# Patient Record
Sex: Female | Born: 1995 | Race: White | Hispanic: No | Marital: Single | Smoking: Never smoker
Health system: Southern US, Community
[De-identification: ages and names within clinical notes are randomized; demographics above are authoritative.]

## PROBLEM LIST (undated history)

## (undated) DIAGNOSIS — Z818 Family history of other mental and behavioral disorders: Secondary | ICD-10-CM

## (undated) DIAGNOSIS — I498 Other specified cardiac arrhythmias: Secondary | ICD-10-CM

## (undated) DIAGNOSIS — G90A Postural orthostatic tachycardia syndrome (POTS): Secondary | ICD-10-CM

## (undated) DIAGNOSIS — E559 Vitamin D deficiency, unspecified: Secondary | ICD-10-CM

## (undated) DIAGNOSIS — I10 Essential (primary) hypertension: Secondary | ICD-10-CM

## (undated) DIAGNOSIS — R000 Tachycardia, unspecified: Secondary | ICD-10-CM

## (undated) DIAGNOSIS — R0789 Other chest pain: Secondary | ICD-10-CM

## (undated) DIAGNOSIS — R5383 Other fatigue: Secondary | ICD-10-CM

## (undated) DIAGNOSIS — J309 Allergic rhinitis, unspecified: Secondary | ICD-10-CM

## (undated) DIAGNOSIS — Z8349 Family history of other endocrine, nutritional and metabolic diseases: Secondary | ICD-10-CM

## (undated) DIAGNOSIS — R42 Dizziness and giddiness: Secondary | ICD-10-CM

## (undated) DIAGNOSIS — D649 Anemia, unspecified: Secondary | ICD-10-CM

## (undated) DIAGNOSIS — R253 Fasciculation: Secondary | ICD-10-CM

## (undated) DIAGNOSIS — F4321 Adjustment disorder with depressed mood: Secondary | ICD-10-CM

## (undated) DIAGNOSIS — F429 Obsessive-compulsive disorder, unspecified: Secondary | ICD-10-CM

## (undated) DIAGNOSIS — I951 Orthostatic hypotension: Secondary | ICD-10-CM

## (undated) DIAGNOSIS — F4323 Adjustment disorder with mixed anxiety and depressed mood: Secondary | ICD-10-CM

## (undated) DIAGNOSIS — R002 Palpitations: Secondary | ICD-10-CM

## (undated) DIAGNOSIS — R0602 Shortness of breath: Secondary | ICD-10-CM

## (undated) DIAGNOSIS — J0100 Acute maxillary sinusitis, unspecified: Secondary | ICD-10-CM

## (undated) DIAGNOSIS — R4589 Other symptoms and signs involving emotional state: Secondary | ICD-10-CM

## (undated) HISTORY — DX: Palpitations: R00.2

## (undated) HISTORY — DX: Anemia, unspecified: D64.9

## (undated) HISTORY — PX: NO PAST SURGERIES: SHX2092

## (undated) HISTORY — DX: Obsessive-compulsive disorder, unspecified: F42.9

## (undated) HISTORY — DX: Other fatigue: R53.83

## (undated) HISTORY — DX: Family history of other endocrine, nutritional and metabolic diseases: Z83.49

## (undated) HISTORY — DX: Shortness of breath: R06.02

## (undated) HISTORY — DX: Fasciculation: R25.3

## (undated) HISTORY — DX: Allergic rhinitis, unspecified: J30.9

## (undated) HISTORY — DX: Essential (primary) hypertension: I10

## (undated) HISTORY — DX: Family history of other mental and behavioral disorders: Z81.8

## (undated) HISTORY — DX: Orthostatic hypotension: I95.1

## (undated) HISTORY — DX: Dizziness and giddiness: R42

## (undated) HISTORY — DX: Other chest pain: R07.89

## (undated) HISTORY — DX: Postural orthostatic tachycardia syndrome (POTS): G90.A

## (undated) HISTORY — DX: Tachycardia, unspecified: R00.0

## (undated) HISTORY — DX: Other specified cardiac arrhythmias: I49.8

## (undated) HISTORY — DX: Acute maxillary sinusitis, unspecified: J01.00

## (undated) HISTORY — DX: Adjustment disorder with depressed mood: F43.21

## (undated) HISTORY — DX: Vitamin D deficiency, unspecified: E55.9

## (undated) HISTORY — DX: Adjustment disorder with mixed anxiety and depressed mood: F43.23

## (undated) HISTORY — DX: Other symptoms and signs involving emotional state: R45.89

---
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
## 2014-04-27 ENCOUNTER — Encounter: Payer: Self-pay | Admitting: Internal Medicine

## 2014-07-01 ENCOUNTER — Encounter: Payer: Self-pay | Admitting: Internal Medicine

## 2014-11-03 ENCOUNTER — Encounter: Payer: Self-pay | Admitting: Internal Medicine

## 2014-11-03 ENCOUNTER — Ambulatory Visit (INDEPENDENT_AMBULATORY_CARE_PROVIDER_SITE_OTHER): Payer: BC Managed Care – PPO | Admitting: Internal Medicine

## 2014-11-03 VITALS — BP 106/50 | Temp 98.7°F | Ht 65.5 in | Wt 121.8 lb

## 2014-11-03 DIAGNOSIS — R109 Unspecified abdominal pain: Secondary | ICD-10-CM

## 2014-11-03 DIAGNOSIS — R002 Palpitations: Secondary | ICD-10-CM | POA: Diagnosis not present

## 2014-11-03 DIAGNOSIS — Z299 Encounter for prophylactic measures, unspecified: Secondary | ICD-10-CM

## 2014-11-03 DIAGNOSIS — E559 Vitamin D deficiency, unspecified: Secondary | ICD-10-CM | POA: Diagnosis not present

## 2014-11-03 DIAGNOSIS — Z418 Encounter for other procedures for purposes other than remedying health state: Secondary | ICD-10-CM

## 2014-11-03 LAB — LIPID PANEL
Cholesterol: 206 mg/dL — ABNORMAL HIGH (ref 0–200)
HDL: 50.5 mg/dL (ref >39.00)
LDL CALC: 133 mg/dL — AB (ref 0–99)
NonHDL: 155.5
Total CHOL/HDL Ratio: 4
Triglycerides: 111 mg/dL (ref 0.0–149.0)
VLDL: 22.2 mg/dL (ref 0.0–40.0)

## 2014-11-03 LAB — CBC WITH DIFFERENTIAL/PLATELET
BASOS PCT: 0.7 % (ref 0.0–3.0)
Basophils Absolute: 0 10*3/uL (ref 0.0–0.1)
EOS PCT: 4.4 % (ref 0.0–5.0)
Eosinophils Absolute: 0.3 10*3/uL (ref 0.0–0.7)
HCT: 37 % (ref 36.0–46.0)
HEMOGLOBIN: 11.8 g/dL — AB (ref 12.0–15.0)
LYMPHS PCT: 28.5 % (ref 12.0–46.0)
Lymphs Abs: 1.7 10*3/uL (ref 0.7–4.0)
MCHC: 31.8 g/dL (ref 30.0–36.0)
MCV: 77.8 fl — AB (ref 78.0–100.0)
MONO ABS: 0.5 10*3/uL (ref 0.1–1.0)
Monocytes Relative: 8.9 % (ref 3.0–12.0)
NEUTROS ABS: 3.4 10*3/uL (ref 1.4–7.7)
NEUTROS PCT: 57.5 % (ref 43.0–77.0)
Platelets: 343 10*3/uL (ref 150.0–400.0)
RBC: 4.75 Mil/uL (ref 3.87–5.11)
RDW: 20.5 % — ABNORMAL HIGH (ref 11.5–14.6)
WBC: 5.9 10*3/uL (ref 4.5–10.5)

## 2014-11-03 LAB — BASIC METABOLIC PANEL
BUN: 7 mg/dL (ref 6–23)
CO2: 26 mEq/L (ref 19–32)
Calcium: 9.5 mg/dL (ref 8.4–10.5)
Chloride: 109 mEq/L (ref 96–112)
Creatinine, Ser: 0.8 mg/dL (ref 0.4–1.2)
GFR: 104.02 mL/min (ref >60.00)
GLUCOSE: 92 mg/dL (ref 70–99)
POTASSIUM: 3.6 meq/L (ref 3.5–5.1)
Sodium: 142 mEq/L (ref 135–145)

## 2014-11-03 LAB — HEPATIC FUNCTION PANEL
ALK PHOS: 57 U/L (ref 39–117)
ALT: 10 U/L (ref 0–35)
AST: 15 U/L (ref 0–37)
Albumin: 4.4 g/dL (ref 3.5–5.2)
BILIRUBIN DIRECT: 0 mg/dL (ref 0.0–0.3)
TOTAL PROTEIN: 7.1 g/dL (ref 6.0–8.3)
Total Bilirubin: 0.2 mg/dL (ref 0.2–1.2)

## 2014-11-03 LAB — IBC PANEL
Iron: 22 ug/dL — ABNORMAL LOW (ref 42–145)
Saturation Ratios: 5.1 % — ABNORMAL LOW (ref 20.0–50.0)
Transferrin: 310.3 mg/dL (ref 212.0–360.0)

## 2014-11-03 LAB — TSH: TSH: 2.66 u[IU]/mL (ref 0.35–5.50)

## 2014-11-03 LAB — SEDIMENTATION RATE: SED RATE: 8 mm/h (ref 0–22)

## 2014-11-03 LAB — T3, FREE: T3, Free: 3.2 pg/mL (ref 2.3–4.2)

## 2014-11-03 LAB — T4, FREE: Free T4: 0.71 ng/dL (ref 0.60–1.60)

## 2014-11-03 NOTE — Progress Notes (Signed)
Pre visit review using our clinic review tool, if applicable. No additional management support is needed unless otherwise documented below in the visit note.  Chief Complaint  Patient presents with  . Establish Care    HPI: Patient  [name]  18 y.o. comes in today for reestablshing a s new patient  Last visit was over 3 years ago .  She has a hx of  Depression and cutting better  Now Vit d defic   And  Low iron    Not a recent c heck .  Has some ocd  Sx .  Doesn't go out side much and exercise . Currently more anxious than depressed . Sees Dr Toy Care.  She had been vegan  But now eats some meat  Gtcc past semester   Jan to continue still stays  room     No more cutting.  Doing better with this.  Stomach same foods all the time cause stomach an issue .   Period  Monthly .   Last   5 - 6 day s. No sa  Attending school CC on line and  inperson doing well   Health Maintenance  Topic Date Due  . TETANUS/TDAP  03/06/2013  . INFLUENZA VACCINE  02/11/2015 (Originally 06/13/2014)  . PAP SMEAR  10/14/2015 (Originally 03/06/2012)   Health Maintenance Review LIFESTYLE:  Exercise:  no Tobacco/ETS: no Alcohol: no Sugar beverages: 2 cokes per day  Large  Water  Sleep: about 8 hours  Drug use: no   ROS:  GEN/ HEENT: No fever, significant weight changes sweats headaches vision problems hearing changes, CV/ PULM; No chest pain shortness of breath cough, syncope,edema  change in exercise tolerance. Has ocass palpitiations     Worries about this has beend checked in past for this  ? Hx of pots and told to eat salt in the past  GI /GU: No adominal pain, vomiting, change in bowel habits. No blood in the stool. No significant GU symptoms. SKIN/HEME: ,no acute skin rashes suspicious lesions or bleeding. No lymphadenopathy, nodules, masses.  NEURO/ PSYCH:  No neurologic signs such as weakness numbness. IMM/ Allergy: No unusual infections.  Allergy .   REST of 12 system review negative except as per  HPI   Past Medical History  Diagnosis Date  . Vitamin D deficiency   . Anemia   . Palpitations   . Acute maxillary sinusitis   . Adjustment disorder with mixed anxiety and depressed mood     hx cutting  . Allergic rhinitis   . Chest discomfort   . Dizziness   . Family history of endocrine disorder   . Family history of thyroid disease   . Fatigue   . HTN (hypertension), benign   . Mourning   . Muscle fasciculation   . SOB (shortness of breath)   . Tachycardia   . POTS (postural orthostatic tachycardia syndrome)   . Family history of schizophrenia   . OCD (obsessive compulsive disorder)     Past Surgical History  Procedure Laterality Date  . No past surgeries      Family History  Problem Relation Age of Onset  . Hyperlipidemia Mother   . Asthma Sister   . Depression Sister   . Colon cancer Maternal Grandmother     Colon  . Diabetes Maternal Grandmother   . Schizophrenia Maternal Grandmother   . Miscarriages / Stillbirths Maternal Grandmother   . Varicose Veins Maternal Grandmother   . Diabetes Maternal Grandfather   .  Hearing loss Maternal Grandfather   . Hyperlipidemia Maternal Grandfather   . Stroke Maternal Grandfather   . Cancer Paternal Grandfather     Esophageal Cancer  . Mental illness Sister     History   Social History  . Marital Status: Single    Spouse Name: N/A    Number of Children: N/A  . Years of Education: N/A   Social History Main Topics  . Smoking status: Never Smoker   . Smokeless tobacco: Never Used  . Alcohol Use: No  . Drug Use: No  . Sexual Activity: No   Other Topics Concern  . None   Social History Narrative   Currently in college studying.  Going part-time.  Working on 6 credits.   Lives at home with mom and her sister.   Has one cat   Gets 8+ hours of sleep per night   HH of 3  Neg tad    Outpatient Encounter Prescriptions as of 11/03/2014  Medication Sig  . buPROPion (WELLBUTRIN XL) 150 MG 24 hr tablet Take 1  tablet by mouth daily.  . cetirizine (ZYRTEC) 10 MG tablet Take 10 mg by mouth daily.  . Cholecalciferol (VITAMIN D3) 2000 UNITS TABS Take 2,000 Units by mouth daily.  . Prenatal Vit-Fe Fumarate-FA (PRENATAL MULTIVITAMIN/IRON PO) Take by mouth.  . sertraline (ZOLOFT) 100 MG tablet Take 2 tablets by mouth daily.    EXAM:  BP 106/50 mmHg  Temp(Src) 98.7 F (37.1 C) (Oral)  Ht 5' 5.5" (1.664 m)  Wt 121 lb 12.8 oz (55.248 kg)  BMI 19.95 kg/m2  LMP 11/01/2014  Body mass index is 19.95 kg/(m^2).  Physical Exam: Vital signs reviewed DXI:PJAS is a well-developed well-nourished alert cooperative    who appearsr stated age in no acute distress.  HEENT: normocephalic atraumatic , Eyes: PERRL EOM's full, conjunctiva clear, Nares: paten,t no deformity discharge or tenderness., Ears: no deformity EAC's clear TMs with normal landmarks. Mouth: clear OP, no lesions, edema.  Moist mucous membranes. Dentition in adequate repair. NECK: supple without masses, thyromegaly or bruits. CHEST/PULM:  Clear to auscultation and percussion breath sounds equal no wheeze , rales or rhonchi. CV: PMI is nondisplaced, S1 S2 no gallops, murmurs, rubs. Peripheral pulses are full without delay.No JVD .  ABDOMEN: Bowel sounds normal nontender  No guard or rebound, no hepato splenomegal no CVA tenderness.  No hernia. Extremtities:  No clubbing cyanosis or edema, no acute joint swelling or redness no focal atrophy NEURO:  Oriented x3, cranial nerves 3-12 appear to be intact, no obvious focal weakness,gait within normal limits no abnormal reflexes or asymmetrical SKIN: No acute rashes normal turgor, color, no bruising or petechiae. PSYCH: Oriented, good eye contact,  cognition and judgment appear normal. LN: no cervical  adenopathy  Lab Results  Component Value Date   WBC 5.9 11/03/2014   HGB 11.8* 11/03/2014   HCT 37.0 11/03/2014   PLT 343.0 11/03/2014   GLUCOSE 92 11/03/2014   CHOL 206* 11/03/2014   TRIG 111.0  11/03/2014   HDL 50.50 11/03/2014   LDLCALC 133* 11/03/2014   ALT 10 11/03/2014   AST 15 11/03/2014   NA 142 11/03/2014   K 3.6 11/03/2014   CL 109 11/03/2014   CREATININE 0.8 11/03/2014   BUN 7 11/03/2014   CO2 26 11/03/2014   TSH 2.66 11/03/2014    ASSESSMENT AND PLAN:  Discussed the following assessment and plan:  Palpitations - recurrent hx of eval dr Garvin Fila and then ov dr Harrington Challenger ?  if poss pots  no syncope anxiety componenet - Plan: Basic metabolic panel, CBC with Differential, Hepatic function panel, Lipid panel, TSH, T4, free, T3, free, Vit D  25 hydroxy (rtn osteoporosis monitoring), Vitamin B12, Celiac panel 10, IBC panel, Ferritin, Sedimentation rate, POCT urinalysis dipstick  Abdominal pain, unspecified abdominal location - pt not concerned as much as mom couldcheck screening labs Celiac screen - Plan: Basic metabolic panel, CBC with Differential, Hepatic function panel, Lipid panel, TSH, T4, free, T3, free, Vit D  25 hydroxy (rtn osteoporosis monitoring), Vitamin B12, Celiac panel 10, IBC panel, Ferritin, Sedimentation rate, POCT urinalysis dipstick  Vitamin D deficiency - Plan: Basic metabolic panel, CBC with Differential, Hepatic function panel, Lipid panel, TSH, T4, free, T3, free, Vit D  25 hydroxy (rtn osteoporosis monitoring), Vitamin B12, Celiac panel 10, IBC panel, Ferritin, Sedimentation rate, POCT urinalysis dipstick  Preventive measure - Plan: Basic metabolic panel, CBC with Differential, Hepatic function panel, Lipid panel, TSH, T4, free, T3, free, Vit D  25 hydroxy (rtn osteoporosis monitoring), Vitamin B12, Celiac panel 10, IBC panel, Ferritin, Sedimentation rate, POCT urinalysis dipstick  Patient Care Team: Burnis Medin, MD as PCP - General Rupinder Charm Rings, MD as Consulting Physician (Psychiatry) Fay Records, MD as Consulting Physician (Cardiology) Patient Instructions   Will notify you  of labs when available.  Sign up for my chart to help with  communication.  Will reveiw the record related  to the palpitations . Will check labs today and let  You know result s.   Plan wellness visit   In  2- 3 months or as needed   Some exercise may help palpitations walking is good.     Why follow it? Research shows. . Those who follow the Mediterranean diet have a reduced risk of heart disease  . The diet is associated with a reduced incidence of Parkinson's and Alzheimer's diseases . People following the diet may have longer life expectancies and lower rates of chronic diseases  . The Dietary Guidelines for Americans recommends the Mediterranean diet as an eating plan to promote health and prevent disease  What Is the Mediterranean Diet?  . Healthy eating plan based on typical foods and recipes of Mediterranean-style cooking . The diet is primarily a plant based diet; these foods should make up a majority of meals   Starches - Plant based foods should make up a majority of meals - They are an important sources of vitamins, minerals, energy, antioxidants, and fiber - Choose whole grains, foods high in fiber and minimally processed items  - Typical grain sources include wheat, oats, barley, corn, brown rice, bulgar, farro, millet, polenta, couscous  - Various types of beans include chickpeas, lentils, fava beans, black beans, white beans   Fruits  Veggies - Large quantities of antioxidant rich fruits & veggies; 6 or more servings  - Vegetables can be eaten raw or lightly drizzled with oil and cooked  - Vegetables common to the traditional Mediterranean Diet include: artichokes, arugula, beets, broccoli, brussel sprouts, cabbage, carrots, celery, collard greens, cucumbers, eggplant, kale, leeks, lemons, lettuce, mushrooms, okra, onions, peas, peppers, potatoes, pumpkin, radishes, rutabaga, shallots, spinach, sweet potatoes, turnips, zucchini - Fruits common to the Mediterranean Diet include: apples, apricots, avocados, cherries, clementines,  dates, figs, grapefruits, grapes, melons, nectarines, oranges, peaches, pears, pomegranates, strawberries, tangerines  Fats - Replace butter and margarine with healthy oils, such as olive oil, canola oil, and tahini  - Limit nuts to no more than a handful  a day  - Nuts include walnuts, almonds, pecans, pistachios, pine nuts  - Limit or avoid candied, honey roasted or heavily salted nuts - Olives are central to the Mediterranean diet - can be eaten whole or used in a variety of dishes   Meats Protein - Limiting red meat: no more than a few times a month - When eating red meat: choose lean cuts and keep the portion to the size of deck of cards - Eggs: approx. 0 to 4 times a week  - Fish and lean poultry: at least 2 a week  - Healthy protein sources include, chicken, [place], lean beef, lamb - Increase intake of seafood such as tuna, salmon, trout, mackerel, shrimp, scallops - Avoid or limit high fat processed meats such as sausage and bacon  Dairy - Include moderate amounts of low fat dairy products  - Focus on healthy dairy such as fat free yogurt, skim milk, low or reduced fat cheese - Limit dairy products higher in fat such as whole or 2% milk, cheese, ice cream  Alcohol - Moderate amounts of red wine is ok  - No more than 5 oz daily for women (all ages) and men older than age 36  - No more than 10 oz of wine daily for men younger than 67  Other - Limit sweets and other desserts  - Use herbs and spices instead of salt to flavor foods  - Herbs and spices common to the traditional Mediterranean Diet include: basil, bay leaves, chives, cloves, cumin, fennel, garlic, lavender, marjoram, mint, oregano, parsley, pepper, rosemary, sage, savory, sumac, tarragon, thyme   It's not just a diet, it's a lifestyle:  . The Mediterranean diet includes lifestyle factors typical of those in the region  . Foods, drinks and meals are best eaten with others and savored . Daily physical activity is important for  overall good health . This could be strenuous exercise like running and aerobics . This could also be more leisurely activities such as walking, housework, yard-work, or taking the stairs . Moderation is the key; a balanced and healthy diet accommodates most foods and drinks . Consider portion sizes and frequency of consumption of certain foods   Meal Ideas & Options:  . Breakfast:  o Whole wheat toast or whole wheat English muffins with peanut butter & hard boiled egg o Steel cut oats topped with apples & cinnamon and skim milk  o Fresh fruit: banana, strawberries, melon, berries, peaches  o Smoothies: strawberries, bananas, greek yogurt, peanut butter o Low fat greek yogurt with blueberries and granola  o Egg white omelet with spinach and mushrooms o Breakfast couscous: whole wheat couscous, apricots, skim milk, cranberries  . Sandwiches:  o Hummus and grilled vegetables (peppers, zucchini, squash) on whole wheat bread   o Grilled chicken on whole wheat pita with lettuce, tomatoes, cucumbers or tzatziki  o Tuna salad on whole wheat bread: tuna salad made with greek yogurt, olives, red peppers, capers, green onions o Garlic rosemary lamb pita: lamb sauted with garlic, rosemary, salt & pepper; add lettuce, cucumber, greek yogurt to pita - flavor with lemon juice and black pepper  . Seafood:  o Mediterranean grilled salmon, seasoned with garlic, basil, parsley, lemon juice and black pepper o Shrimp, lemon, and spinach whole-grain pasta salad made with low fat greek yogurt  o Seared scallops with lemon orzo  o Seared tuna steaks seasoned salt, pepper, coriander topped with tomato mixture of olives, tomatoes, olive oil, minced garlic, parsley,  green onions and cappers  . Meats:  o Herbed greek chicken salad with kalamata olives, cucumber, feta  o Red bell peppers stuffed with spinach, bulgur, lean ground beef (or lentils) & topped with feta   o Kebabs: skewers of chicken, tomatoes, onions,  zucchini, squash  o [place] burgers: made with red onions, mint, dill, lemon juice, feta cheese topped with roasted red peppers . Vegetarian o Cucumber salad: cucumbers, artichoke hearts, celery, red onion, feta cheese, tossed in olive oil & lemon juice  o Hummus and whole grain pita points with a greek salad (lettuce, tomato, feta, olives, cucumbers, red onion) o Lentil soup with celery, carrots made with vegetable broth, garlic, salt and pepper  o Tabouli salad: parsley, bulgur, mint, scallions, cucumbers, tomato, radishes, lemon juice, olive oil, salt and pepper.          Standley Brooking. Bernabe Dorce M.D.  Remote hs of dr [facility] in past  No arrhythmia  considier ation of pots  Dr Harrington Challenger  b no tilt table testing .   Consult note not found. Total visit 37mins > 50% spent counseling and coordinating care   Lab Results  Component Value Date   WBC 5.9 11/03/2014   HGB 11.8* 11/03/2014   HCT 37.0 11/03/2014   PLT 343.0 11/03/2014   GLUCOSE 92 11/03/2014   CHOL 206* 11/03/2014   TRIG 111.0 11/03/2014   HDL 50.50 11/03/2014   LDLCALC 133* 11/03/2014   ALT 10 11/03/2014   AST 15 11/03/2014   NA 142 11/03/2014   K 3.6 11/03/2014   CL 109 11/03/2014   CREATININE 0.8 11/03/2014   BUN 7 11/03/2014   CO2 26 11/03/2014   TSH 2.66 11/03/2014

## 2014-11-03 NOTE — Patient Instructions (Addendum)
Will notify you  of labs when available.  Sign up for my chart to help with communication.  Will reveiw the record related  to the palpitations . Will check labs today and let  You know result s.   Plan wellness visit   In  2- 3 months or as needed   Some exercise may help palpitations walking is good.     Why follow it? Research shows. . Those who follow the Mediterranean diet have a reduced risk of heart disease  . The diet is associated with a reduced incidence of Parkinson's and Alzheimer's diseases . People following the diet may have longer life expectancies and lower rates of chronic diseases  . The Dietary Guidelines for Americans recommends the Mediterranean diet as an eating plan to promote health and prevent disease  What Is the Mediterranean Diet?  . Healthy eating plan based on typical foods and recipes of Mediterranean-style cooking . The diet is primarily a plant based diet; these foods should make up a majority of meals   Starches - Plant based foods should make up a majority of meals - They are an important sources of vitamins, minerals, energy, antioxidants, and fiber - Choose whole grains, foods high in fiber and minimally processed items  - Typical grain sources include wheat, oats, barley, corn, brown rice, bulgar, farro, millet, polenta, couscous  - Various types of beans include chickpeas, lentils, fava beans, black beans, white beans   Fruits  Veggies - Large quantities of antioxidant rich fruits & veggies; 6 or more servings  - Vegetables can be eaten raw or lightly drizzled with oil and cooked  - Vegetables common to the traditional Mediterranean Diet include: artichokes, arugula, beets, broccoli, brussel sprouts, cabbage, carrots, celery, collard greens, cucumbers, eggplant, kale, leeks, lemons, lettuce, mushrooms, okra, onions, peas, peppers, potatoes, pumpkin, radishes, rutabaga, shallots, spinach, sweet potatoes, turnips, zucchini - Fruits common to the  Mediterranean Diet include: apples, apricots, avocados, cherries, clementines, dates, figs, grapefruits, grapes, melons, nectarines, oranges, peaches, pears, pomegranates, strawberries, tangerines  Fats - Replace butter and margarine with healthy oils, such as olive oil, canola oil, and tahini  - Limit nuts to no more than a handful a day  - Nuts include walnuts, almonds, pecans, pistachios, pine nuts  - Limit or avoid candied, honey roasted or heavily salted nuts - Olives are central to the [employer] - can be eaten whole or used in a variety of dishes   Meats Protein - Limiting red meat: no more than a few times a month - When eating red meat: choose lean cuts and keep the portion to the size of deck of cards - Eggs: approx. 0 to 4 times a week  - Fish and lean poultry: at least 2 a week  - Healthy protein sources include, chicken, [place], lean beef, lamb - Increase intake of seafood such as tuna, salmon, trout, mackerel, shrimp, scallops - Avoid or limit high fat processed meats such as sausage and bacon  Dairy - Include moderate amounts of low fat dairy products  - Focus on healthy dairy such as fat free yogurt, skim milk, low or reduced fat cheese - Limit dairy products higher in fat such as whole or 2% milk, cheese, ice cream  Alcohol - Moderate amounts of red wine is ok  - No more than 5 oz daily for women (all ages) and men older than age 66  - No more than 10 oz of wine daily for men younger than  65  Other - Limit sweets and other desserts  - Use herbs and spices instead of salt to flavor foods  - Herbs and spices common to the traditional Mediterranean Diet include: basil, bay leaves, chives, cloves, cumin, fennel, garlic, lavender, marjoram, mint, oregano, parsley, pepper, rosemary, sage, savory, sumac, tarragon, thyme   It's not just a diet, it's a lifestyle:  . The Mediterranean diet includes lifestyle factors typical of those in the region  . Foods, drinks and meals are  best eaten with others and savored . Daily physical activity is important for overall good health . This could be strenuous exercise like running and aerobics . This could also be more leisurely activities such as walking, housework, yard-work, or taking the stairs . Moderation is the key; a balanced and healthy diet accommodates most foods and drinks . Consider portion sizes and frequency of consumption of certain foods   Meal Ideas & Options:  . Breakfast:  o Whole wheat toast or whole wheat English muffins with peanut butter & hard boiled egg o Steel cut oats topped with apples & cinnamon and skim milk  o Fresh fruit: banana, strawberries, melon, berries, peaches  o Smoothies: strawberries, bananas, greek yogurt, peanut butter o Low fat greek yogurt with blueberries and granola  o Egg white omelet with spinach and mushrooms o Breakfast couscous: whole wheat couscous, apricots, skim milk, cranberries  . Sandwiches:  o Hummus and grilled vegetables (peppers, zucchini, squash) on whole wheat bread   o Grilled chicken on whole wheat pita with lettuce, tomatoes, cucumbers or tzatziki  o Tuna salad on whole wheat bread: tuna salad made with greek yogurt, olives, red peppers, capers, green onions o Garlic rosemary lamb pita: lamb sauted with garlic, rosemary, salt & pepper; add lettuce, cucumber, greek yogurt to pita - flavor with lemon juice and black pepper  . Seafood:  o Mediterranean grilled salmon, seasoned with garlic, basil, parsley, lemon juice and black pepper o Shrimp, lemon, and spinach whole-grain pasta salad made with low fat greek yogurt  o Seared scallops with lemon orzo  o Seared tuna steaks seasoned salt, pepper, coriander topped with tomato mixture of olives, tomatoes, olive oil, minced garlic, parsley, green onions and cappers  . Meats:  o Herbed greek chicken salad with kalamata olives, cucumber, feta  o Red bell peppers stuffed with spinach, bulgur, lean ground beef (or  lentils) & topped with feta   o Kebabs: skewers of chicken, tomatoes, onions, zucchini, squash  o [place] burgers: made with red onions, mint, dill, lemon juice, feta cheese topped with roasted red peppers . Vegetarian o Cucumber salad: cucumbers, artichoke hearts, celery, red onion, feta cheese, tossed in olive oil & lemon juice  o Hummus and whole grain pita points with a greek salad (lettuce, tomato, feta, olives, cucumbers, red onion) o Lentil soup with celery, carrots made with vegetable broth, garlic, salt and pepper  o Tabouli salad: parsley, bulgur, mint, scallions, cucumbers, tomato, radishes, lemon juice, olive oil, salt and pepper.

## 2014-11-04 LAB — CELIAC PANEL 10
Endomysial Screen: NEGATIVE
GLIADIN IGA: 3 U (ref <20)
Gliadin IgG: 2 Units (ref <20)
IGA: 87 mg/dL (ref 69–380)
TISSUE TRANSGLUT AB: 3 U/mL (ref <6)
Tissue Transglutaminase Ab, IgA: 1 U/mL (ref <4)

## 2014-11-04 LAB — VITAMIN D 25 HYDROXY (VIT D DEFICIENCY, FRACTURES): VITD: 42.73 ng/mL (ref 30.00–100.00)

## 2014-11-04 LAB — FERRITIN: Ferritin: 4.7 ng/mL — ABNORMAL LOW (ref 10.0–291.0)

## 2014-11-04 LAB — VITAMIN B12: VITAMIN B 12: 701 pg/mL (ref 211–911)

## 2014-11-08 ENCOUNTER — Encounter: Payer: Self-pay | Admitting: Internal Medicine

## 2014-11-08 DIAGNOSIS — R109 Unspecified abdominal pain: Secondary | ICD-10-CM | POA: Insufficient documentation

## 2014-11-08 DIAGNOSIS — E559 Vitamin D deficiency, unspecified: Secondary | ICD-10-CM | POA: Insufficient documentation

## 2014-11-08 DIAGNOSIS — D509 Iron deficiency anemia, unspecified: Secondary | ICD-10-CM | POA: Insufficient documentation

## 2015-06-16 ENCOUNTER — Encounter (HOSPITAL_COMMUNITY): Payer: Self-pay | Admitting: *Deleted

## 2015-06-16 ENCOUNTER — Emergency Department (HOSPITAL_COMMUNITY)
Admission: EM | Admit: 2015-06-16 | Discharge: 2015-06-16 | Disposition: A | Discharge status: Home / Self Care | Payer: Self-pay | Attending: Emergency Medicine | Admitting: Emergency Medicine

## 2015-06-16 DIAGNOSIS — R42 Dizziness and giddiness: Secondary | ICD-10-CM | POA: Insufficient documentation

## 2015-06-16 DIAGNOSIS — I10 Essential (primary) hypertension: Secondary | ICD-10-CM | POA: Insufficient documentation

## 2015-06-16 DIAGNOSIS — Z862 Personal history of diseases of the blood and blood-forming organs and certain disorders involving the immune mechanism: Secondary | ICD-10-CM | POA: Insufficient documentation

## 2015-06-16 DIAGNOSIS — R110 Nausea: Secondary | ICD-10-CM | POA: Insufficient documentation

## 2015-06-16 DIAGNOSIS — Z79899 Other long term (current) drug therapy: Secondary | ICD-10-CM | POA: Insufficient documentation

## 2015-06-16 DIAGNOSIS — Z880 Allergy status to penicillin: Secondary | ICD-10-CM | POA: Insufficient documentation

## 2015-06-16 DIAGNOSIS — Z8709 Personal history of other diseases of the respiratory system: Secondary | ICD-10-CM | POA: Insufficient documentation

## 2015-06-16 DIAGNOSIS — Z8639 Personal history of other endocrine, nutritional and metabolic disease: Secondary | ICD-10-CM | POA: Insufficient documentation

## 2015-06-16 DIAGNOSIS — Z8659 Personal history of other mental and behavioral disorders: Secondary | ICD-10-CM | POA: Insufficient documentation

## 2015-06-16 MED ORDER — ONDANSETRON 4 MG PO TBDP
4.0000 mg | ORAL_TABLET | Freq: Once | ORAL | Status: AC
  Administered 2015-06-16: 4 mg via ORAL
  Filled 2015-06-16: qty 1

## 2015-06-16 MED ORDER — MECLIZINE HCL 25 MG PO TABS
ORAL_TABLET | ORAL | Status: DC
Start: 2015-06-16 — End: 2016-06-30

## 2015-06-16 MED ORDER — MECLIZINE HCL 25 MG PO TABS
25.0000 mg | ORAL_TABLET | Freq: Once | ORAL | Status: AC
  Administered 2015-06-16: 25 mg via ORAL
  Filled 2015-06-16: qty 1

## 2015-06-16 MED ORDER — ONDANSETRON 4 MG PO TBDP: 4.0000 mg | ORAL_TABLET | Freq: Three times a day (TID) | ORAL | Status: DC | PRN

## 2015-06-16 NOTE — ED Notes (Signed)
Pt states that when she woke up this morning, she felt very dizzy and had difficulty walking in a straight line.  Her legs felt cold from the knees down and she was shaking uncontrollably.  Her symptoms have resolved at this point in time, but she wants to be evaluated just to be sure.  Pt denies vomiting, diarrhea, fever, chills, and vision changes but states that she has felt nauseous.  Pt states that there is no chance that she is pregnant.  No acute distress noted.  Pt's mother is at the bedside.

## 2015-06-16 NOTE — Discharge Instructions (Signed)
Medication (Meclizine) until 24 hours without symptoms. No driving until 24 hours without medication and dizziness.  Benign Positional Vertigo Vertigo means you feel like you or your surroundings are moving when they are not. Benign positional vertigo is the most common form of vertigo. Benign means that the cause of your condition is not serious. Benign positional vertigo is more common in older adults. CAUSES  Benign positional vertigo is the result of an upset in the labyrinth system. This is an area in the middle ear that helps control your balance. This may be caused by a viral infection, head injury, or repetitive motion. However, often no specific cause is found. SYMPTOMS  Symptoms of benign positional vertigo occur when you move your head or eyes in different directions. Some of the symptoms may include:  Loss of balance and falls.  Vomiting.  Blurred vision.  Dizziness.  Nausea.  Involuntary eye movements (nystagmus). DIAGNOSIS  Benign positional vertigo is usually diagnosed by physical exam. If the specific cause of your benign positional vertigo is unknown, your caregiver may perform imaging tests, such as magnetic resonance imaging (MRI) or computed tomography (CT). TREATMENT  Your caregiver may recommend movements or procedures to correct the benign positional vertigo. Medicines such as meclizine, benzodiazepines, and medicines for nausea may be used to treat your symptoms. In rare cases, if your symptoms are caused by certain conditions that affect the inner ear, you may need surgery. HOME CARE INSTRUCTIONS   Follow your caregiver's instructions.  Move slowly. Do not make sudden body or head movements.  Avoid driving.  Avoid operating heavy machinery.  Avoid performing any tasks that would be dangerous to you or others during a vertigo episode.  Drink enough fluids to keep your urine clear or pale yellow. SEEK IMMEDIATE MEDICAL CARE IF:   You develop problems  with walking, weakness, numbness, or using your arms, hands, or legs.  You have difficulty speaking.  You develop severe headaches.  Your nausea or vomiting continues or gets worse.  You develop visual changes.  Your family or friends notice any behavioral changes.  Your condition gets worse.  You have a fever.  You develop a stiff neck or sensitivity to light. MAKE SURE YOU:   Understand these instructions.  Will watch your condition.  Will get help right away if you are not doing well or get worse. Document Released: 08/07/2006 Document Revised: 01/22/2012 Document Reviewed: 07/20/2011 [facility] Patient Information 2015 [place], [place]. This information is not intended to replace advice given to you by your health care provider. Make sure you discuss any questions you have with your health care provider.

## 2015-06-16 NOTE — ED Notes (Signed)
MD at bedside. 

## 2015-06-16 NOTE — ED Notes (Signed)
Pt complains of dizziness and coldness in her feet. Pt states she woke up this morning feeling dizziness, nausea, difficulty walking. Pt no longer complains of difficulty walking. Pt's mother states the pt's eyes appeared dilated when she first woke today. Pt states she went to bed at 4AM feeling normal. Pt denies drug/alcohol use.

## 2015-06-16 NOTE — ED Provider Notes (Signed)
CSN: [id number]     Arrival date & time 06/16/15  1447 History   First MD Initiated Contact with Patient 06/16/15 1711     Chief Complaint  Patient presents with  . Dizziness      HPI  Patient presents evaluation with dizziness all day. States she awakened this morning when she sat up and that she felt like the room shifting around. She typically blocked the bathroom and thought she had a hold onto the furniture in the walls to get there. Had some mild nausea. Felt the course today she's had recurrence of symptoms. Better she holds still, worse with movements. No additional symptoms of nausea. No vomiting. All other injuries or trauma to the head. No recent URI or head neck infections. Otherwise healthy female. History of depression on chronic medications.  Past Medical History  Diagnosis Date  . Vitamin D deficiency   . Anemia   . Palpitations   . Acute maxillary sinusitis   . Adjustment disorder with mixed anxiety and depressed mood     hx cutting  . Allergic rhinitis   . Chest discomfort   . Dizziness   . Family history of endocrine disorder   . Family history of thyroid disease   . Fatigue   . HTN (hypertension), benign   . Mourning   . Muscle fasciculation   . SOB (shortness of breath)   . Tachycardia   . POTS (postural orthostatic tachycardia syndrome)   . Family history of schizophrenia   . OCD (obsessive compulsive disorder)    Past Surgical History  Procedure Laterality Date  . No past surgeries     Family History  Problem Relation Age of Onset  . Hyperlipidemia Mother   . Asthma Sister   . Depression Sister   . Colon cancer Maternal Grandmother     Colon  . Diabetes Maternal Grandmother   . Schizophrenia Maternal Grandmother   . Miscarriages / Stillbirths Maternal Grandmother   . Varicose Veins Maternal Grandmother   . Diabetes Maternal Grandfather   . Hearing loss Maternal Grandfather   . Hyperlipidemia Maternal Grandfather   . Stroke Maternal  Grandfather   . Cancer Paternal Grandfather     Esophageal Cancer  . Mental illness Sister    History  Substance Use Topics  . Smoking status: Never Smoker   . Smokeless tobacco: Never Used  . Alcohol Use: No   OB History    Gravida Para Term Preterm AB TAB SAB Ectopic Multiple Living   0 0 0 0 0 0 0 0 0 0      Review of Systems  Constitutional: Negative for fever, chills, diaphoresis, appetite change and fatigue.  HENT: Negative for mouth sores, sore throat and trouble swallowing.   Eyes: Negative for visual disturbance.  Respiratory: Negative for cough, chest tightness, shortness of breath and wheezing.   Cardiovascular: Negative for chest pain.  Gastrointestinal: Positive for nausea. Negative for vomiting, abdominal pain, diarrhea and abdominal distention.  Endocrine: Negative for polydipsia, polyphagia and polyuria.  Genitourinary: Negative for dysuria, frequency and hematuria.  Musculoskeletal: Negative for gait problem.  Skin: Negative for color change, pallor and rash.  Neurological: Positive for dizziness. Negative for syncope, light-headedness and headaches.  Hematological: Does not bruise/bleed easily.  Psychiatric/Behavioral: Negative for behavioral problems and confusion.      Allergies  Penicillins  Home Medications   Prior to Admission medications   Medication Sig Start Date End Date Taking? Authorizing Provider  buPROPion (WELLBUTRIN XL) 150 MG  24 hr tablet Take 150 mg by mouth daily.  10/30/14  Yes Historical Provider, MD  cetirizine (ZYRTEC) 10 MG tablet Take 10 mg by mouth daily.   Yes Historical Provider, MD  Cholecalciferol (VITAMIN D3) 2000 UNITS TABS Take 2,000 Units by mouth daily.   Yes Historical Provider, MD  Multiple Vitamins-Minerals (MULTIVITAMIN ADULT PO) Take 1 tablet by mouth daily.   Yes Historical Provider, MD  Omega-3 Fatty Acids (OMEGA 3 PO) Take 1 tablet by mouth daily.   Yes Historical Provider, MD  sertraline (ZOLOFT) 100 MG tablet  Take 200 mg by mouth daily.  10/22/14  Yes Historical Provider, MD  meclizine (ANTIVERT) 25 MG tablet Take until 24 hours without dizziness 06/16/15   Tanna Furry, MD  ondansetron (ZOFRAN ODT) 4 MG disintegrating tablet Take 1 tablet (4 mg total) by mouth every 8 (eight) hours as needed for nausea. 06/16/15   Tanna Furry, MD   BP 133/70 mmHg  Pulse 137  Temp(Src) 98.1 F (36.7 C) (Oral)  Resp 18  SpO2 98% Physical Exam  Constitutional: She is oriented to person, place, and time. She appears well-developed and well-nourished. No distress.  HENT:  Head: Normocephalic.  Eyes: Conjunctivae are normal. Pupils are equal, round, and reactive to light. No scleral icterus.  Neck: Normal range of motion. Neck supple. No thyromegaly present.  Cardiovascular: Normal rate and regular rhythm.  Exam reveals no gallop and no friction rub.   No murmur heard. Pulmonary/Chest: Effort normal and breath sounds normal. No respiratory distress. She has no wheezes. She has no rales.  Abdominal: Soft. Bowel sounds are normal. She exhibits no distension. There is no tenderness. There is no rebound.  Musculoskeletal: Normal range of motion.  Neurological: She is alert and oriented to person, place, and time.  Symptomatic with head movements and sitting upright. States it is" very mild". No other focal neurological deficits.  Skin: Skin is warm and dry. No rash noted.  Psychiatric: She has a normal mood and affect. Her behavior is normal.    ED Course  Procedures (including critical care time) Labs Review Labs Reviewed - No data to display  Imaging Review No results found.   EKG Interpretation None      MDM   Final diagnoses:  Vertigo    SHEENT heart rate down to 95. She seems very reassured after examination and discussion. Anxious appropriate for treatment and outpatient discharge without testing in the emergency room. Given ODT Zofran. Given by mouth's meclizine. Plan is home, medications, no driving  until 24 hours without symptoms. Meclizine until 24 hours without symptoms.    Tanna Furry, MD 06/16/15 1728

## 2015-11-17 ENCOUNTER — Emergency Department (HOSPITAL_COMMUNITY): Payer: BLUE CROSS/BLUE SHIELD

## 2015-11-17 ENCOUNTER — Emergency Department (HOSPITAL_COMMUNITY)
Admission: EM | Admit: 2015-11-17 | Discharge: 2015-11-18 | Disposition: A | Discharge status: Home / Self Care | Payer: BLUE CROSS/BLUE SHIELD | Attending: Emergency Medicine | Admitting: Emergency Medicine

## 2015-11-17 ENCOUNTER — Encounter (HOSPITAL_COMMUNITY): Payer: Self-pay | Admitting: Emergency Medicine

## 2015-11-17 ENCOUNTER — Telehealth: Payer: Self-pay | Admitting: Internal Medicine

## 2015-11-17 DIAGNOSIS — E559 Vitamin D deficiency, unspecified: Secondary | ICD-10-CM | POA: Insufficient documentation

## 2015-11-17 DIAGNOSIS — Z8709 Personal history of other diseases of the respiratory system: Secondary | ICD-10-CM | POA: Insufficient documentation

## 2015-11-17 DIAGNOSIS — F429 Obsessive-compulsive disorder, unspecified: Secondary | ICD-10-CM | POA: Diagnosis not present

## 2015-11-17 DIAGNOSIS — R000 Tachycardia, unspecified: Secondary | ICD-10-CM | POA: Diagnosis not present

## 2015-11-17 DIAGNOSIS — F329 Major depressive disorder, single episode, unspecified: Secondary | ICD-10-CM | POA: Insufficient documentation

## 2015-11-17 DIAGNOSIS — Z880 Allergy status to penicillin: Secondary | ICD-10-CM | POA: Insufficient documentation

## 2015-11-17 DIAGNOSIS — F410 Panic disorder [episodic paroxysmal anxiety] without agoraphobia: Secondary | ICD-10-CM | POA: Insufficient documentation

## 2015-11-17 DIAGNOSIS — R079 Chest pain, unspecified: Secondary | ICD-10-CM

## 2015-11-17 DIAGNOSIS — I10 Essential (primary) hypertension: Secondary | ICD-10-CM | POA: Insufficient documentation

## 2015-11-17 DIAGNOSIS — Z79899 Other long term (current) drug therapy: Secondary | ICD-10-CM | POA: Diagnosis not present

## 2015-11-17 LAB — CBC
HEMATOCRIT: 43.8 % (ref 36.0–46.0)
HEMOGLOBIN: 14.8 g/dL (ref 12.0–15.0)
MCH: 30.5 pg (ref 26.0–34.0)
MCHC: 33.8 g/dL (ref 30.0–36.0)
MCV: 90.1 fL (ref 78.0–100.0)
Platelets: 281 10*3/uL (ref 150–400)
RBC: 4.86 MIL/uL (ref 3.87–5.11)
RDW: 12.5 % (ref 11.5–15.5)
WBC: 9.2 10*3/uL (ref 4.0–10.5)

## 2015-11-17 LAB — BASIC METABOLIC PANEL
ANION GAP: 10 (ref 5–15)
BUN: 7 mg/dL (ref 6–20)
CHLORIDE: 107 mmol/L (ref 101–111)
CO2: 25 mmol/L (ref 22–32)
Calcium: 10 mg/dL (ref 8.9–10.3)
Creatinine, Ser: 0.8 mg/dL (ref 0.44–1.00)
GFR calc non Af Amer: >60 mL/min (ref >60)
Glucose, Bld: 121 mg/dL — ABNORMAL HIGH (ref 65–99)
POTASSIUM: 3.7 mmol/L (ref 3.5–5.1)
Sodium: 142 mmol/L (ref 135–145)

## 2015-11-17 LAB — I-STAT BETA HCG BLOOD, ED (MC, WL, AP ONLY)

## 2015-11-17 LAB — CBG MONITORING, ED: GLUCOSE-CAPILLARY: 99 mg/dL (ref 65–99)

## 2015-11-17 LAB — TSH: TSH: 1.819 u[IU]/mL (ref 0.350–4.500)

## 2015-11-17 LAB — I-STAT TROPONIN, ED: Troponin i, poc: 0 ng/mL (ref 0.00–0.08)

## 2015-11-17 MED ORDER — SODIUM CHLORIDE 0.9 % IV BOLUS (SEPSIS)
2000.0000 mL | Freq: Once | INTRAVENOUS | Status: AC
  Administered 2015-11-17: 2000 mL via INTRAVENOUS

## 2015-11-17 MED ORDER — BACITRACIN ZINC 500 UNIT/GM EX OINT
TOPICAL_OINTMENT | CUTANEOUS | Status: AC
  Filled 2015-11-17: qty 0.9

## 2015-11-17 MED ORDER — LORAZEPAM 2 MG/ML IJ SOLN
0.5000 mg | Freq: Once | INTRAMUSCULAR | Status: AC
  Administered 2015-11-17: 0.5 mg via INTRAVENOUS
  Filled 2015-11-17: qty 1

## 2015-11-17 MED ORDER — ONDANSETRON 4 MG PO TBDP
4.0000 mg | ORAL_TABLET | Freq: Once | ORAL | Status: AC | PRN
  Administered 2015-11-17: 4 mg via ORAL
  Filled 2015-11-17: qty 1

## 2015-11-17 NOTE — ED Notes (Signed)
Hx of panic attacks. Mother states has been having a panic attack over the past couple days. Today began having chest pains and palpitations with "body twitching", HR 120 in triage. Pt states she hasn't been eating or drinking much over the last couple days, lips appear dry and cracked.

## 2015-11-17 NOTE — Discharge Instructions (Signed)
Social Anxiety Disorder °Social anxiety disorder, previously called social phobia, is a mental disorder. People with social anxiety disorder frequently feel nervous, afraid, or embarrassed when around other people in social situations. They constantly worry that other people are judging or criticizing them for how they look, what they say, or how they act. They may worry that other people might reject them because of their appearance or behavior. °Social anxiety disorder is more than just occasional shyness or self-consciousness. It can cause severe emotional distress. It can interfere with daily life activities. Social anxiety disorder also may lead to excessive alcohol or drug use and even suicide.  °Social anxiety disorder is actually one of the most common mental disorders. It can develop at any time but usually starts in the teenage years. Women are more commonly affected than men. Social anxiety disorder is also more common in people who have family members with anxiety disorders. It also is more common in people who have physical deformities or conditions with characteristics that are obvious to others, such as stuttered speech or movement abnormalities (Parkinson disease).  °SYMPTOMS  °In addition to feeling anxious or fearful in social situations, people with social anxiety disorder frequently have physical symptoms. Examples include: °· Red face (blushing). °· Racing heart. °· Sweating. °· Shaky hands or voice. °· Confusion. °· Light-headedness. °· Upset stomach and diarrhea. °DIAGNOSIS  °Social anxiety disorder is diagnosed through an assessment by your health care provider. Your health care provider will ask you questions about your mood, thoughts, and reactions in social situations. Your health care provider may ask you about your medical history and use of alcohol or drugs, including prescription medicines. Certain medical conditions and the use of certain substances, including caffeine, can cause  symptoms similar to social anxiety disorder. Your health care provider may refer you to a mental health specialist for further evaluation or treatment. °The criteria for diagnosis of social anxiety disorder are: °· Marked fear or anxiety in one or more social situations in which you may be closely watched or studied by others. Examples of such situations include: °¨ Interacting socially (having a conversation with others, going to a party, or meeting strangers). °¨ Being observed (eating or drinking in public or being called on in class). °¨ Performing in front of others (giving a speech). °· The social situations of concern almost always cause fear or anxiety, not just occasionally. °· People with social anxiety disorder fear that they will be viewed negatively in a way that will be embarrassing, will lead to rejection, or will offend others. This fear is out of proportion to the actual threat posed by the social situation. °· Often the triggering social situations are avoided, or they are endured with intense fear or anxiety. The fear, anxiety, or avoidance is persistent and lasts for 6 months or longer. °· The anxiety causes difficulty functioning in at least some parts of your daily life. °TREATMENT  °Several types of treatment are available for social anxiety disorder. These treatments are often used in combination and include:  °· Talk therapy. Group talk therapy allows you to see that you are not alone with these problems. Individual talk therapy helps you address your specific anxiety issues with a caring professional. The most effective forms of talk therapy for social anxiety disorder are cognitive-behavioral therapy and exposure therapy. Cognitive-behavioral therapy helps you to identify and change negative thoughts and beliefs that are at the root of the disorder. Exposure therapy allows you to gradually face the situations   that you fear most.  Relaxation and coping techniques. These include deep  breathing, self-talk, meditation, visual imagery, and yoga. Relaxation techniques help to keep you calm in social situations.  Social Company secretary.Social skills can be learned on your own or with the help of a talk therapist. They can help you feel more confident and comfortable in social situations.  Medicine. For anxiety limited to performance situations (performance anxiety), medicine called beta blockers can help by reducing or preventing the physical symptoms of social anxiety disorder. For more persistent and generalized social anxiety, antidepressant medicine may be prescribed to help control symptoms. In severe cases of social anxiety disorder, strong antianxiety medicine, called benzodiazepines, may be prescribed on a limited basis and for a short time.   This information is not intended to replace advice given to you by your health care provider. Make sure you discuss any questions you have with your health care provider.   Document Released: 09/28/2005 Document Revised: 11/20/2014 Document Reviewed: 01/28/2013 Elsevier Interactive Patient Education 2016 [employer] Nonspecific Tachycardia Tachycardia is a faster than normal heartbeat (more than 100 beats per minute). In adults, the heart normally beats between 60 and 100 times a minute. A fast heartbeat may be a normal response to exercise or stress. It does not necessarily mean that something is wrong. However, sometimes when your heart beats too fast it may not be able to pump enough blood to the rest of your body. This can result in chest pain, shortness of breath, dizziness, and even fainting. Nonspecific tachycardia means that the specific cause or pattern of your tachycardia is unknown. CAUSES  Tachycardia may be harmless or it may be due to a more serious underlying cause. Possible causes of tachycardia include:  Exercise or exertion.  Fever.  Pain or injury.  Infection.  Loss of body fluids  (dehydration).  Overactive thyroid.  Lack of red blood cells (anemia).  Anxiety and stress.  Alcohol.  Caffeine.  Tobacco products.  Diet pills.  Illegal drugs.  Heart disease. SYMPTOMS  Rapid or irregular heartbeat (palpitations).  Suddenly feeling your heart beating (cardiac awareness).  Dizziness.  Tiredness (fatigue).  Shortness of breath.  Chest pain.  Nausea.  Fainting. DIAGNOSIS  Your caregiver will perform a physical exam and take your medical history. In some cases, a heart specialist (cardiologist) may be consulted. Your caregiver may also order:  Blood tests.  Electrocardiography. This test records the electrical activity of your heart.  A heart monitoring test. TREATMENT  Treatment will depend on the likely cause of your tachycardia. The goal is to treat the underlying cause of your tachycardia. Treatment methods may include:  Replacement of fluids or blood through an intravenous (IV) tube for moderate to severe dehydration or anemia.  New medicines or changes in your current medicines.  Diet and lifestyle changes.  Treatment for certain infections.  Stress relief or relaxation methods. HOME CARE INSTRUCTIONS   Rest.  Drink enough fluids to keep your urine clear or pale yellow.  Do not smoke.  Avoid:  Caffeine.  Tobacco.  Alcohol.  Chocolate.  Stimulants such as over-the-counter diet pills or pills that help you stay awake.  Situations that cause anxiety or stress.  Illegal drugs such as marijuana, phencyclidine (PCP), and cocaine.  Only take medicine as directed by your caregiver.  Keep all follow-up appointments as directed by your caregiver. SEEK IMMEDIATE MEDICAL CARE IF:   You have pain in your chest, upper arms, jaw, or neck.  You become weak,  dizzy, or feel faint.  You have palpitations that will not go away.  You vomit, have diarrhea, or pass blood in your stool.  Your skin is cool, pale, and wet.  You  have a fever that will not go away with rest, fluids, and medicine. MAKE SURE YOU:   Understand these instructions.  Will watch your condition.  Will get help right away if you are not doing well or get worse.   This information is not intended to replace advice given to you by your health care provider. Make sure you discuss any questions you have with your health care provider.   Document Released: 12/07/2004 Document Revised: 01/22/2012 Document Reviewed: 05/14/2015 Elsevier Interactive Patient Education [employer].

## 2015-11-17 NOTE — ED Provider Notes (Signed)
CSN: [id number]     Arrival date & time 11/17/15  1719 History   First MD Initiated Contact with Patient 11/17/15 2124     Chief Complaint  Patient presents with  . Palpitations  . Chest Pain  . Panic Attack     (Consider location/radiation/quality/duration/timing/severity/associated sxs/prior Treatment) HPI Comments: Patient here complaining of increased anxiety with associated palpitations without dyspnea or syncope. Patient has a history of panic disorder and takes Wellbutrin as well as Zoloft. No recent changes to her medications. Denies any excessive caffeine use. Symptoms seem to wax and wane nothing makes them better. Denies any illicit drug use. No recent vomiting or fever. No abdominal pain or diarrhea. No vaginal bleeding. Denies any prior history of thyroid disorder. Patient also notes some chest discomfort to described as tightness. Does note some decreased oral intake  Patient is a 20 y.o. female presenting with palpitations and chest pain. The history is provided by the patient.  Palpitations Associated symptoms: chest pain   Chest Pain Associated symptoms: palpitations     Past Medical History  Diagnosis Date  . Vitamin D deficiency   . Anemia   . Palpitations   . Acute maxillary sinusitis   . Adjustment disorder with mixed anxiety and depressed mood     hx cutting  . Allergic rhinitis   . Chest discomfort   . Dizziness   . Family history of endocrine disorder   . Family history of thyroid disease   . Fatigue   . HTN (hypertension), benign   . Mourning   . Muscle fasciculation   . SOB (shortness of breath)   . Tachycardia   . POTS (postural orthostatic tachycardia syndrome)   . Family history of schizophrenia   . OCD (obsessive compulsive disorder)    Past Surgical History  Procedure Laterality Date  . No past surgeries     Family History  Problem Relation Age of Onset  . Hyperlipidemia Mother   . Asthma Sister   . Depression Sister   . Colon cancer  Maternal Grandmother     Colon  . Diabetes Maternal Grandmother   . Schizophrenia Maternal Grandmother   . Miscarriages / Stillbirths Maternal Grandmother   . Varicose Veins Maternal Grandmother   . Diabetes Maternal Grandfather   . Hearing loss Maternal Grandfather   . Hyperlipidemia Maternal Grandfather   . Stroke Maternal Grandfather   . Cancer Paternal Grandfather     Esophageal Cancer  . Mental illness Sister    Social History  Substance Use Topics  . Smoking status: Never Smoker   . Smokeless tobacco: Never Used  . Alcohol Use: No   OB History    Gravida Para Term Preterm AB TAB SAB Ectopic Multiple Living   0 0 0 0 0 0 0 0 0 0      Review of Systems  Cardiovascular: Positive for chest pain and palpitations.  All other systems reviewed and are negative.     Allergies  Penicillins  Home Medications   Prior to Admission medications   Medication Sig Start Date End Date Taking? Authorizing Provider  buPROPion (WELLBUTRIN XL) 150 MG 24 hr tablet Take 150 mg by mouth daily.  10/30/14  Yes Historical Provider, MD  calcium-vitamin D (OSCAL WITH D) 500-200 MG-UNIT tablet Take 1 tablet by mouth daily.   Yes Historical Provider, MD  cetirizine (ZYRTEC) 10 MG tablet Take 10 mg by mouth daily.   Yes Historical Provider, MD  Cholecalciferol (VITAMIN D3) 2000 UNITS TABS  Take 2,000 Units by mouth daily.   Yes Historical Provider, MD  Multiple Vitamins-Minerals (MULTIVITAMIN ADULT PO) Take 1 tablet by mouth daily.   Yes Historical Provider, MD  Omega-3 Fatty Acids (OMEGA 3 PO) Take 1 tablet by mouth daily.   Yes Historical Provider, MD  sertraline (ZOLOFT) 100 MG tablet Take 200 mg by mouth daily.  10/22/14  Yes Historical Provider, MD  meclizine (ANTIVERT) 25 MG tablet Take until 24 hours without dizziness Patient not taking: Reported on 11/17/2015 06/16/15   Tanna Furry, MD  ondansetron (ZOFRAN ODT) 4 MG disintegrating tablet Take 1 tablet (4 mg total) by mouth every 8 (eight) hours  as needed for nausea. Patient not taking: Reported on 11/17/2015 06/16/15   Tanna Furry, MD   BP 119/69 mmHg  Pulse 119  Temp(Src) 98.6 F (37 C) (Oral)  Resp 18  SpO2 99% Physical Exam  Constitutional: She is oriented to person, place, and time. She appears well-developed and well-nourished.  Non-toxic appearance. No distress.  HENT:  Head: Normocephalic and atraumatic.  Eyes: Conjunctivae, EOM and lids are normal. Pupils are equal, round, and reactive to light.  Neck: Normal range of motion. Neck supple. No tracheal deviation present. No thyroid mass present.  Cardiovascular: Regular rhythm and normal heart sounds.  Tachycardia present.  Exam reveals no gallop.   No murmur heard. Pulmonary/Chest: Effort normal and breath sounds normal. No stridor. No respiratory distress. She has no decreased breath sounds. She has no wheezes. She has no rhonchi. She has no rales.  Abdominal: Soft. Normal appearance and bowel sounds are normal. She exhibits no distension. There is no tenderness. There is no rebound and no CVA tenderness.  Musculoskeletal: Normal range of motion. She exhibits no edema or tenderness.  Neurological: She is alert and oriented to person, place, and time. She has normal strength. No cranial nerve deficit or sensory deficit. GCS eye subscore is 4. GCS verbal subscore is 5. GCS motor subscore is 6.  Skin: Skin is warm and dry. No abrasion and no rash noted.  Psychiatric: Her speech is normal and behavior is normal. Her affect is blunt.  Nursing note and vitals reviewed.   ED Course  Procedures (including critical care time) Labs Review Labs Reviewed  BASIC METABOLIC PANEL - Abnormal; Notable for the following:    Glucose, Bld 121 (*)    All other components within normal limits  CBC  T4  TSH  I-STAT TROPOININ, ED  CBG MONITORING, ED  I-STAT BETA HCG BLOOD, ED ([facility], [facility], [facility] ONLY)    Imaging Review No results found. I have personally reviewed and evaluated these images  and lab results as part of my medical decision-making.   EKG Interpretation   Date/Time:  Wednesday November 17 2015 17:34:53 EST Ventricular Rate:  112 PR Interval:  129 QRS Duration: 93 QT Interval:  304 QTC Calculation: 415 R Axis:   -148 Text Interpretation:  Sinus tachycardia Right atrial enlargement Right  axis deviation Borderline repolarization abnormality Baseline wander in  lead(s) V3 agree. no old comp. Confirmed by Johnney Killian, MD, Jeannie Done [phone]) on  11/17/2015 6:11:14 PM      MDM   Final diagnoses:  None    Patient given IV fluids and Ativan and heart rate has improved. Suspect that her symptoms are anxiety related and she will follow-up with her psychiatrist . Her TSH was also normal as well 2.    Lacretia Leigh, MD 11/17/15 [phone]

## 2015-11-17 NOTE — Telephone Encounter (Signed)
Patient Name: [name]  DOB: 1996-09-04    Initial Comment Caller states her dtr is having chest pains associated with anxiety. Her bpm 104 and heart rate is 98   Nurse Assessment  Nurse: Christel Mormon, RN, Levada Dy Date/Time (Eastern Time): 11/17/2015 4:30:30 PM  Confirm and document reason for call. If symptomatic, describe symptoms. ---Dtr is on Wellbutrin and Zoloft and has been having panic attacks. Today her chest is hurting a little and has had some missed beats. She states she has not had a panic attack today. She slept 13 hrs which is not normal for her. Her last panic attack was before bed last night. She is not on anything for panic attacks.  Has the patient traveled out of the country within the last 30 days? ---No  Does the patient have any new or worsening symptoms? ---Yes  Will a triage be completed? ---Yes  Related visit to physician within the last 2 weeks? ---No  Does the PT have any chronic conditions? (i.e. diabetes, asthma, etc.) ---Yes  List chronic conditions. ---saw cardiologist at age 80- decided the chest pain was due to her panic attacks, OCD  Did the patient indicate they were pregnant? ---No  Is this a behavioral health or substance abuse call? ---No     Guidelines    Guideline Title Affirmed Question Affirmed Notes  Chest Pain Chest pain lasts > 5 minutes (Exceptions: chest pain occurring > 3 days ago and now asymptomatic; same as previously diagnosed heartburn and has accompanying sour taste in mouth)    Final Disposition User   Go to ED Now (or PCP triage) [place], RN, Levada Dy    Comments  PT wants to go to [facility] but mother is leaning towards ER   Referrals  GO TO FACILITY OTHER - SPECIFY   Disagree/Comply: Comply

## 2015-11-18 NOTE — Telephone Encounter (Signed)
Pt seen in ED on 11/17/15

## 2015-11-19 LAB — T4: T4, Total: 6.2 ug/dL (ref 4.5–12.0)

## 2016-05-30 ENCOUNTER — Encounter (HOSPITAL_COMMUNITY): Payer: Self-pay | Admitting: Psychiatry

## 2016-05-30 ENCOUNTER — Ambulatory Visit (INDEPENDENT_AMBULATORY_CARE_PROVIDER_SITE_OTHER): Payer: BLUE CROSS/BLUE SHIELD | Admitting: Psychiatry

## 2016-05-30 VITALS — BP 112/64 | HR 81 | Ht 65.0 in | Wt 116.6 lb

## 2016-05-30 DIAGNOSIS — F429 Obsessive-compulsive disorder, unspecified: Secondary | ICD-10-CM | POA: Diagnosis not present

## 2016-05-30 DIAGNOSIS — Z79899 Other long term (current) drug therapy: Secondary | ICD-10-CM

## 2016-05-30 DIAGNOSIS — F4001 Agoraphobia with panic disorder: Secondary | ICD-10-CM | POA: Diagnosis not present

## 2016-05-30 DIAGNOSIS — F411 Generalized anxiety disorder: Secondary | ICD-10-CM

## 2016-05-30 DIAGNOSIS — F331 Major depressive disorder, recurrent, moderate: Secondary | ICD-10-CM | POA: Insufficient documentation

## 2016-05-30 DIAGNOSIS — F4010 Social phobia, unspecified: Secondary | ICD-10-CM | POA: Insufficient documentation

## 2016-05-30 MED ORDER — BUPROPION HCL ER (XL) 150 MG PO TB24: 150.0000 mg | ORAL_TABLET | Freq: Every day | ORAL | Status: DC

## 2016-05-30 MED ORDER — ALPRAZOLAM 0.5 MG PO TABS: 0.5000 mg | ORAL_TABLET | Freq: Every evening | ORAL | Status: DC | PRN

## 2016-05-30 MED ORDER — QUETIAPINE FUMARATE 50 MG PO TABS: 50.0000 mg | ORAL_TABLET | Freq: Every day | ORAL | Status: DC

## 2016-05-30 NOTE — Patient Instructions (Signed)
Call [phone] for EKG 

## 2016-05-30 NOTE — Progress Notes (Signed)
Psychiatric Initial Adult Assessment   Patient Identification: [name] MRN:  [id number] Date of Evaluation:  05/30/2016 Referral Source: self Chief Complaint:   Chief Complaint    Anxiety     Visit Diagnosis:    ICD-9-CM ICD-10-CM   1. GAD (generalized anxiety disorder) 300.02 F41.1 QUEtiapine (SEROQUEL) 50 MG tablet  2. Social anxiety disorder 300.23 F40.10 QUEtiapine (SEROQUEL) 50 MG tablet  3. 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 OCD (obsessive compulsive disorder) 300.3 F42.9 QUEtiapine (SEROQUEL) 50 MG tablet  5. MDD (major depressive disorder), recurrent episode, moderate (HCC) 296.32 F33.1 buPROPion (WELLBUTRIN XL) 150 MG 24 hr tablet     QUEtiapine (SEROQUEL) 50 MG tablet  6. Encounter for long-term (current) use of medications V58.69 Z79.899 EKG    History of Present Illness:  Pt here to establish care. She is seeing Dr. Toy Care but plans to transfer here. Pt reports she has been diagnosed with Depression and Anxiety. She is currently treated with Zoloft, Wellbutrin and Xanax.   Pt reports she began having random panic attacks about once a month since Jan. States she has tachycardia, palpations, SOB and a general feeling of dread. It lasts for a few minutes and she treats with Xanax. She is taking Xanax about 1-2x/monhts. Reports SE of fatigue.   Pt rarely goes out because of her anxiety. Pt reports racing thoughts and GI upset.  Pt reports she does not have friends. She feels anxious socializing with anyone. She see's no one besides her mom and sister. In school she never spoke with anyone. Pt spends her time playing games online.  Pt reports depression began around the age of 49 when she changed schools and was bullied in elementary school. Pt went to a therapist but didn't talk to her. Today denies depression. States depression comes and goes thru out the month. Reports anhedonia, low  motivation, worthlessness and hopelessness. Denies SI/HI.   Sleep is good and she is getting about 10 hrs/night. Energy is low. Appetite is good. Concentration is poor.  States Wellbutrin helped with energy initially but it doesn't anymore. Pt has been taking Wellbutrin since 2010. Pt does not think Zoloft is helping at all and has been taking it for several years. Denies SE.   Associated Signs/Symptoms: Depression Symptoms:  depressed mood, anhedonia, fatigue, feelings of worthlessness/guilt, difficulty concentrating, hopelessness,   (Hypo) Manic Symptoms:  negative  Denies manic and hypomanic symptoms including periods of decreased need for sleep, increased energy, mood lability, impulsivity, FOI, and excessive spending.   Anxiety Symptoms:  Agoraphobia, Excessive Worry, Panic Symptoms, Obsessive Compulsive Symptoms:   performing a random action to prevent something bad happening to family, Social Anxiety, denies symptoms of phobias   Psychotic Symptoms:  negative   PTSD Symptoms: Negative  Past Psychiatric History:  Dx: Depression, Anxiety Meds: Lexapro Previous psychiatrist/therapist: Dr. Toy Care for 2016-17, Jola Babinski on a weekly basis Hospitalizations: [facility] 2009 for SIB SIB: last time was in 2009- cutting Suicide attempts: 2 before 2009 Hx of violent behavior towards others: denies Current access to guns: denies Hx of abuse: denies Military Hx: denies Hx of Seizures: denies Hx of TBI: denies   Previous Psychotropic Medications: Yes   Substance Abuse History in the last 12 months:  No.  Consequences of Substance Abuse: Negative  Past Medical History:  Past Medical History  Diagnosis Date  . Vitamin D deficiency   . Anemia   .  Palpitations   . Acute maxillary sinusitis   . Adjustment disorder with mixed anxiety and depressed mood     hx cutting  . Allergic rhinitis   . Chest discomfort   . Dizziness   . Family history of endocrine disorder   .  Family history of thyroid disease   . Fatigue   . HTN (hypertension), benign   . Mourning   . Muscle fasciculation   . SOB (shortness of breath)   . Tachycardia   . POTS (postural orthostatic tachycardia syndrome)   . Family history of schizophrenia   . OCD (obsessive compulsive disorder)     Past Surgical History  Procedure Laterality Date  . No past surgeries      Family Psychiatric and Medical History:  Family History  Problem Relation Age of Onset  . Hyperlipidemia Mother   . Asthma Sister   . Depression Sister   . Colon cancer Maternal Grandmother     Colon  . Diabetes Maternal Grandmother   . Schizophrenia Maternal Grandmother   . Miscarriages / Stillbirths Maternal Grandmother   . Varicose Veins Maternal Grandmother   . Diabetes Maternal Grandfather   . Hearing loss Maternal Grandfather   . Hyperlipidemia Maternal Grandfather   . Stroke Maternal Grandfather   . Cancer Paternal Grandfather     Esophageal Cancer  . Mental illness Sister     Social History:   Social History   Social History  . Marital Status: Single    Spouse Name: N/A  . Number of Children: 0  . Years of Education: 66   Social History Main Topics  . Smoking status: Never Smoker   . Smokeless tobacco: Never Used  . Alcohol Use: No  . Drug Use: No  . Sexual Activity: No   Other Topics Concern  . None   Social History Narrative   Currently in college studying, online going part-time.  Working on 6 credits.   Lives at home with mom and her sister in [place].   Has one cat   Unemployed.    Single, no kids       Allergies:   Allergies  Allergen Reactions  . Penicillins Swelling    Has patient had a PCN reaction causing immediate rash, facial/tongue/throat swelling, SOB or lightheadedness with hypotension: Yes Has patient had a PCN reaction causing severe rash involving mucus membranes or skin necrosis: Yes Has patient had a PCN reaction that required hospitalization No. Has patient  had a PCN reaction occurring within the last 10 years: No. If all of the above answers are "NO", then may proceed with Cephalosporin use.     Metabolic Disorder Labs: No results found for: HGBA1C, MPG No results found for: PROLACTIN Lab Results  Component Value Date   CHOL 206* 11/03/2014   TRIG 111.0 11/03/2014   HDL 50.50 11/03/2014   CHOLHDL 4 11/03/2014   VLDL 22.2 11/03/2014   LDLCALC 133* 11/03/2014   LDLCALC 75 09/11/2007     Current Medications: Current Outpatient Prescriptions  Medication Sig Dispense Refill  . ALPRAZolam (XANAX) 0.5 MG tablet Take 0.5 mg by mouth at bedtime as needed for anxiety.    Marland Kitchen buPROPion (WELLBUTRIN XL) 150 MG 24 hr tablet Take 150 mg by mouth daily.   0  . calcium-vitamin D (OSCAL WITH D) 500-200 MG-UNIT tablet Take 1 tablet by mouth daily.    . cetirizine (ZYRTEC) 10 MG tablet Take 10 mg by mouth daily.    . Cholecalciferol (VITAMIN D3)  2000 UNITS TABS Take 2,000 Units by mouth daily.    . Multiple Vitamins-Minerals (MULTIVITAMIN ADULT PO) Take 1 tablet by mouth daily.    . Omega-3 Fatty Acids (OMEGA 3 PO) Take 1 tablet by mouth daily.    . sertraline (ZOLOFT) 100 MG tablet Take 200 mg by mouth daily.   0  . meclizine (ANTIVERT) 25 MG tablet Take until 24 hours without dizziness (Patient not taking: Reported on 11/17/2015) 28 tablet 0  . ondansetron (ZOFRAN ODT) 4 MG disintegrating tablet Take 1 tablet (4 mg total) by mouth every 8 (eight) hours as needed for nausea. (Patient not taking: Reported on 11/17/2015) 5 tablet 0   No current facility-administered medications for this visit.    Neurologic: Headache: Yes Seizure: No Paresthesias:No  Musculoskeletal: Strength & Muscle Tone: within normal limits Gait & Station: normal Patient leans: straight  Psychiatric Specialty Exam: Review of Systems  Constitutional: Negative for fever and chills.  HENT: Negative for congestion, ear pain, nosebleeds and sore throat.   Eyes: Negative for  blurred vision, double vision and pain.  Respiratory: Negative for cough, shortness of breath and wheezing.   Cardiovascular: Negative for chest pain, palpitations and leg swelling.  Gastrointestinal: Negative for heartburn, nausea, vomiting and abdominal pain.  Musculoskeletal: Negative for myalgias, back pain, joint pain and neck pain.  Skin: Negative for itching and rash.  Neurological: Positive for headaches. Negative for dizziness, tremors, speech change, seizures, loss of consciousness and weakness.  Psychiatric/Behavioral: Positive for depression. Negative for suicidal ideas, hallucinations and substance abuse. The patient is nervous/anxious. The patient does not have insomnia.     Blood pressure 112/64, pulse 81, height 5\' 5"  (1.651 m), weight 116 lb 9.6 oz (52.889 kg).Body mass index is 19.4 kg/(m^2).  General Appearance: Disheveled  Eye Contact:  Minimal  Speech:  Clear and Coherent and Slow  Volume:  Decreased  Mood:  Anxious and Depressed  Affect:  Congruent  Thought Process:  Slow with some thought blocking  Orientation:  Full (Time, Place, and Person)  Thought Content:  WDL  Suicidal Thoughts:  No  Homicidal Thoughts:  No  Memory:  Immediate;   Fair Recent;   Fair Remote;   Fair  Judgement:  Poor  Insight:  Shallow  Psychomotor Activity:  Decreased  Concentration:  Concentration: Poor  Recall:  Poor  Fund of Knowledge:Fair  Language: Fair  Akathisia:  No  Handed:  Right  AIMS (if indicated):  n/a  Assets:  Desire for Improvement Housing Social Support Transportation  ADL's:  Intact  Cognition: WNL  Sleep:  good    Treatment Plan Summary: Medication management and Plan see below  Assessment:  GAD; Social Anxiety disorder; Panic disorder with agoraphobia; OCD; MDD-recurrent, moderate   Medication management with supportive therapy. Risks/benefits and SE of the medication discussed. Pt verbalized understanding and verbal consent obtained for treatment.   Affirm with the patient that the medications are taken as ordered. Patient expressed understanding of how their medications were to be used.  Meds: Continue Wellbutrin XL 150mg  po qD for mood Continue Xanax 0.5mg  po qD prn anxiety D/C Zoloft Start Seroquel 50mg  po qHS for mood and anxiety   Labs: Ekg Nov 18, 2015 QTc 415 Glu 121, CBC wNL, TSH WNL Ordered EKG   Therapy: brief supportive therapy provided. Discussed psychosocial stressors in detail.   Encouraged pt to develop daily routine and work on daily goal setting as a way to improve mood symptoms.   Consultations:  Encouraged to  continue individual therapy  Pt denies SI and is at an acute low risk for suicide. Patient told to call clinic if any problems occur. Patient advised to go to ER if they should develop SI/HI, side effects, or if symptoms worsen. Has crisis numbers to call if needed. Pt verbalized understanding.  F/up in 2 months or sooner if needed   Charlcie Cradle, MD 7/18/20172:41 PM

## 2016-06-22 ENCOUNTER — Telehealth: Payer: Self-pay | Admitting: Internal Medicine

## 2016-06-22 NOTE — Telephone Encounter (Signed)
Mom states pt has had a med change and needs to see Dr Regis Bill. Dr Charlcie Cradle suggested pt get an EKG. Pt has a lot of anxiety.  Pt has stopped Zoloft for 9 days now. Dr Doyne Keel wants to prescribe Seroquel, but pt does not want to take. Pt has some questions about her meds.  Pt's mom is out of town [place], [place] and wed and needs appointment on a Thurs or Friday next week, in the afternoon.

## 2016-06-23 NOTE — Telephone Encounter (Signed)
Not in office  Thursday pm but   Ok to add on Friday  August 18th

## 2016-06-23 NOTE — Telephone Encounter (Signed)
Pt has been scheduled.  °

## 2016-06-29 NOTE — Progress Notes (Signed)
Pre visit review using our clinic review tool, if applicable. No additional management support is needed unless otherwise documented below in the visit note.  Chief Complaint  Patient presents with  . Follow-up    HPI: [name] 20 y.o.  Comes in with mom today .  She has a diagnosis of OCD social anxiety among other symptoms.  Had been on sertraline   200 mg   Dr brown   New psych and doubled  sertraline and then had missed beats and then   Off  Sertraline  But on   wellbutrin   Anxiety is up.  Since being off the sertraline. She didn't think it was doing much but noted a difference when she weaned off slowly.  Dr Ricky Stabs.    To do Seroquel low-dose 50 mg at night her sisters on this and is done well however she is quite anxious about taking this medication her doctor wanted her to get an EKG 3-5 days after beginning. She still getting occasional  Brain zap nausea  she states from the sertraline withdrawal or being off of it.         See last phone message   On meds and psych advised EKG and they had? About medications  She has sad gad and  Hx of tachycardia paloitation prev evaluated in by Dr. Filbert Schilder pediatric cardiology when she was age 61. Not felt to have any structural heart disease or difficulty and her fast heart rate and palpitations were felt to be from anxiety. She had an event monitor done that was normal. Seen in ed  Jan 17 for tachycardia felt to be anxiety  Thyroid nl and given iv ativan .Marland Kitchen...   Rodena Goldmann  CBT   And PHD.   Seeing her regularly and is doing well with this. She lives at home with her mom tends to stay in her room tends to be up all night go to bed at 6 AM sleeps in the day. She is doing online classes for IT forensics  And  likew [employer] of [employer]  Somewhat picky in the diet but is taking calcium vitamin D supplements and fish oil. No unusual weight loss. She herself is worried about taking medicine that could affect her heart. ROS: See pertinent  positives and negatives per HPI.  Past Medical History:  Diagnosis Date  . Acute maxillary sinusitis   . Adjustment disorder with mixed anxiety and depressed mood    hx cutting  . Allergic rhinitis   . Anemia   . Chest discomfort   . Dizziness   . Family history of endocrine disorder   . Family history of schizophrenia   . Family history of thyroid disease   . Fatigue   . HTN (hypertension), benign   . Mourning   . Muscle fasciculation   . OCD (obsessive compulsive disorder)   . Palpitations   . POTS (postural orthostatic tachycardia syndrome)   . SOB (shortness of breath)   . Tachycardia   . Vitamin D deficiency     Family History  Problem Relation Age of Onset  . Hyperlipidemia Mother   . Asthma Sister   . Depression Sister   . Colon cancer Maternal Grandmother     Colon  . Diabetes Maternal Grandmother   . Schizophrenia Maternal Grandmother   . Miscarriages / Stillbirths Maternal Grandmother   . Varicose Veins Maternal Grandmother   . Diabetes Maternal Grandfather   . Hearing loss Maternal Grandfather   .  Hyperlipidemia Maternal Grandfather   . Stroke Maternal Grandfather   . Cancer Paternal Grandfather     Esophageal Cancer  . Mental illness Sister     Social History   Social History  . Marital status: Single    Spouse name: N/A  . Number of children: 0  . Years of education: 26   Social History Main Topics  . Smoking status: Never Smoker  . Smokeless tobacco: Never Used  . Alcohol use No  . Drug use: No  . Sexual activity: No   Other Topics Concern  . None   Social History Narrative   Currently in college studying, online going part-time.  Working on 6 credits.   Lives at home with mom and her sister in [place].   Has one cat   Unemployed.    Single, no kids       Outpatient Medications Prior to Visit  Medication Sig Dispense Refill  . ALPRAZolam (XANAX) 0.5 MG tablet Take 1 tablet (0.5 mg total) by mouth at bedtime as needed for anxiety. 30  tablet 2  . buPROPion (WELLBUTRIN XL) 150 MG 24 hr tablet Take 1 tablet (150 mg total) by mouth daily. 30 tablet 2  . calcium-vitamin D (OSCAL WITH D) 500-200 MG-UNIT tablet Take 1 tablet by mouth daily.    . cetirizine (ZYRTEC) 10 MG tablet Take 10 mg by mouth daily.    . Cholecalciferol (VITAMIN D3) 2000 UNITS TABS Take 2,000 Units by mouth daily.    . Multiple Vitamins-Minerals (MULTIVITAMIN ADULT PO) Take 1 tablet by mouth daily.    . Omega-3 Fatty Acids (OMEGA 3 PO) Take 1 tablet by mouth daily.    . QUEtiapine (SEROQUEL) 50 MG tablet Take 1 tablet (50 mg total) by mouth at bedtime. (Patient not taking: Reported on 06/30/2016) 30 tablet 2  . meclizine (ANTIVERT) 25 MG tablet Take until 24 hours without dizziness (Patient not taking: Reported on 11/17/2015) 28 tablet 0  . ondansetron (ZOFRAN ODT) 4 MG disintegrating tablet Take 1 tablet (4 mg total) by mouth every 8 (eight) hours as needed for nausea. (Patient not taking: Reported on 11/17/2015) 5 tablet 0   No facility-administered medications prior to visit.      EXAM:  BP 106/70 (BP Location: Right Arm, Patient Position: Sitting, Cuff Size: Normal)   Temp 98.8 F (37.1 C) (Oral)   Wt 115 lb 3.2 oz (52.3 kg)   BMI 19.17 kg/m   Body mass index is 19.17 kg/m.  GENERAL: vitals reviewed and listed above, alert, oriented, appears well hydrated and in no acute distress HEENT: atraumatic, conjunctiva  clear, no obvious abnormalities on inspection of external nose and ears OP : no lesion edema or exudate  NECK: no obvious masses on inspection palpation  LUNGS: clear to auscultation bilaterally, no wheezes, rales or rhonchi, good air movement CV: HRRR, no clubbing cyanosis or  peripheral edema nl cap refill  Abdomen:  Sof,t normal bowel sounds without hepatosplenomegaly, no guarding rebound or masses no CVA tenderness MS: moves all extremities without noticeable focal  abnormality PSYCH: pleasant and cooperative, no obvious depression    Mild anxiety   Wt Readings from Last 3 Encounters:  06/30/16 115 lb 3.2 oz (52.3 kg)  11/03/14 121 lb 12.8 oz (55.2 kg)  10/17/10 110 lb (49.9 kg) (27 %, Z= -0.61)*   * Growth percentiles are based on CDC 2-20 Years data.   Lab Results  Component Value Date   WBC 9.2 11/17/2015   HGB  14.8 11/17/2015   HCT 43.8 11/17/2015   PLT 281 11/17/2015   GLUCOSE 121 (H) 11/17/2015   CHOL 206 (H) 11/03/2014   TRIG 111.0 11/03/2014   HDL 50.50 11/03/2014   LDLCALC 133 (H) 11/03/2014   ALT 10 11/03/2014   AST 15 11/03/2014   NA 142 11/17/2015   K 3.7 11/17/2015   CL 107 11/17/2015   CREATININE 0.80 11/17/2015   BUN 7 11/17/2015   CO2 25 11/17/2015   TSH 1.819 11/17/2015    ekg nsr ASSESSMENT AND PLAN:  Discussed the following assessment and plan:  Palpitations - Plan: EKG 12-Lead  SYMPTOM, TACHYCARDIA NOS  Medication management  Anxiety state  OCD (obsessive compulsive disorder)  Social anxiety disorder Reviewed the record in regard to her previous cardiology evaluation I know she is quite anxious about medication and I can advise her about effectiveness in specific however she tries a very low dose medicine I ensure she will do fine. She can try 25 mg at night to 50 mg as ordered and follow-up with her psychiatrist. We can see her in a week to repeat this EKG for reassurance. Don't see a need to do blood work today. -Patient advised to return or notify health care team  if symptoms worsen ,persist or new concerns arise. Total visit 42mins > 50% spent counseling and coordinating care as indicated in above note and in instructions to patient .     Patient Instructions  Your baseline EKG is normal. I think it is safe to take low-dose Seroquel in your situation. You can begin the medication and come back next week and we can repeat an EKG. It is reassuring that you had a thorough evaluation of your heart when he was 11. Make sure you're getting enough calcium vitamin D in your  diet as you are with your supplements. Fast heart rate in skipped beats occur very frequently and healthy people and the adrenaline from anxiety can make your heartbeat faster. This does not mean you have a heart problem. Usually its the  heart reacting to the other things happening such as anxiety panic     Standley Brooking. Panosh M.D.

## 2016-06-30 ENCOUNTER — Encounter: Payer: Self-pay | Admitting: Internal Medicine

## 2016-06-30 ENCOUNTER — Ambulatory Visit (INDEPENDENT_AMBULATORY_CARE_PROVIDER_SITE_OTHER): Payer: BLUE CROSS/BLUE SHIELD | Admitting: Internal Medicine

## 2016-06-30 VITALS — BP 106/70 | Temp 98.8°F | Wt 115.2 lb

## 2016-06-30 DIAGNOSIS — Z79899 Other long term (current) drug therapy: Secondary | ICD-10-CM

## 2016-06-30 DIAGNOSIS — F4010 Social phobia, unspecified: Secondary | ICD-10-CM

## 2016-06-30 DIAGNOSIS — F411 Generalized anxiety disorder: Secondary | ICD-10-CM | POA: Diagnosis not present

## 2016-06-30 DIAGNOSIS — F429 Obsessive-compulsive disorder, unspecified: Secondary | ICD-10-CM

## 2016-06-30 DIAGNOSIS — R000 Tachycardia, unspecified: Secondary | ICD-10-CM

## 2016-06-30 DIAGNOSIS — R002 Palpitations: Secondary | ICD-10-CM | POA: Diagnosis not present

## 2016-06-30 NOTE — Patient Instructions (Signed)
Your baseline EKG is normal. I think it is safe to take low-dose Seroquel in your situation. You can begin the medication and come back next week and we can repeat an EKG. It is reassuring that you had a thorough evaluation of your heart when he was 11. Make sure you're getting enough calcium vitamin D in your diet as you are with your supplements. Fast heart rate in skipped beats occur very frequently and healthy people and the adrenaline from anxiety can make your heartbeat faster. This does not mean you have a heart problem. Usually its the  heart reacting to the other things happening such as anxiety panic

## 2016-07-21 ENCOUNTER — Ambulatory Visit: Payer: BLUE CROSS/BLUE SHIELD | Admitting: Internal Medicine

## 2016-07-21 ENCOUNTER — Other Ambulatory Visit (HOSPITAL_COMMUNITY): Payer: Self-pay

## 2016-07-21 MED ORDER — QUETIAPINE FUMARATE 25 MG PO TABS: 25.0000 mg | ORAL_TABLET | Freq: Every day | ORAL | 2 refills | Status: DC

## 2016-07-21 NOTE — Progress Notes (Signed)
Patient had her EKG for the Seroquel - she is still very anxious about taking a new medication, her PCP recommended she start out on a 25 mg dose. I called Dr. Doyne Keel and she stated that this would be fine, I galled the new dose into the pharmacy and informed the patient

## 2016-07-28 ENCOUNTER — Ambulatory Visit: Payer: BLUE CROSS/BLUE SHIELD | Admitting: Internal Medicine

## 2016-08-07 ENCOUNTER — Telehealth: Payer: Self-pay | Admitting: Internal Medicine

## 2016-08-07 NOTE — Telephone Encounter (Signed)
Pt would like a referral to see female cardiologist for palps

## 2016-08-08 NOTE — Telephone Encounter (Signed)
Pt has been sch

## 2016-08-08 NOTE — Telephone Encounter (Signed)
Pt is past due for follow up and EKG (started new medication).  Please see if you can get her on the schedule.  Can talk about referral at [facility].  Thanks!!

## 2016-08-10 ENCOUNTER — Encounter (HOSPITAL_COMMUNITY): Payer: Self-pay | Admitting: Psychiatry

## 2016-08-10 ENCOUNTER — Ambulatory Visit (INDEPENDENT_AMBULATORY_CARE_PROVIDER_SITE_OTHER): Payer: BLUE CROSS/BLUE SHIELD | Admitting: Psychiatry

## 2016-08-10 VITALS — BP 100/68 | HR 88 | Ht 65.0 in | Wt 116.6 lb

## 2016-08-10 DIAGNOSIS — F4010 Social phobia, unspecified: Secondary | ICD-10-CM | POA: Diagnosis not present

## 2016-08-10 DIAGNOSIS — F411 Generalized anxiety disorder: Secondary | ICD-10-CM

## 2016-08-10 DIAGNOSIS — F331 Major depressive disorder, recurrent, moderate: Secondary | ICD-10-CM | POA: Diagnosis not present

## 2016-08-10 DIAGNOSIS — F429 Obsessive-compulsive disorder, unspecified: Secondary | ICD-10-CM

## 2016-08-10 MED ORDER — BUPROPION HCL ER (XL) 150 MG PO TB24: 150.0000 mg | ORAL_TABLET | Freq: Every day | ORAL | 2 refills | Status: DC

## 2016-08-10 NOTE — Progress Notes (Signed)
[facility] MD/PA/NP OP Progress Note  08/10/2016 1:10 PM [name]  MRN:  [id number]  Chief Complaint:  Chief Complaint    Follow-up; Medication Refill     Subjective:  Pt denies panic attacks for the last one month. States she has no desire to go out or interact with others. She spends most of her time on the computer where she plays interactive games.   Anxiety is very high and always present. States she often worries if she does something (like a play a game or makes a certain move in a game) then her mom or herself will die. She will usually do it anyway and eventually gets over that anxiety. Often worries she will die in her sleep and still gets 9-10 hrs/night. She takes Xanax twice since it was prescribed.   Pt states she doesn't like food so mom will find something for her to eat. Pt has 2 meals/day.   Pt showers about once a month. She washes clothes about once a month due to fear of the soap.  States she has been depressed for several weeks. Denies anhedonia, crying, hopelessness. Denies SI/HI/AVH.  Reports compliance with Wellbutrin and denies SE.    Here with mother. States pt stopped Zoloft and went thru withdrawal for a few days afterwards. Since she has been afraid to start Seroquel. In 8th grade pt was having panic attacks that could be related to heart problems. She wore a halter monitor for 2 months and results were normal. Pt states in room 24/7 due to fear of panic attacks. Pt is attending therapy weekly. Pt is doing online classes at [employer].   Visit Diagnosis:    ICD-9-CM ICD-10-CM   1. GAD (generalized anxiety disorder) 300.02 F41.1   2. MDD (major depressive disorder), recurrent episode, moderate (HCC) 296.32 F33.1 buPROPion (WELLBUTRIN XL) 150 MG 24 hr tablet  3. Social anxiety disorder 300.23 F40.10   4. OCD (obsessive compulsive disorder) 300.3 F42.9       Past Psychiatric History:  Dx: Depression, Anxiety Meds: Lexapro Previous psychiatrist/therapist: Dr. Toy Care  for 2016-17, Jola Babinski on a weekly basis Hospitalizations: [facility] 2009 for SIB SIB: last time was in 2009- cutting Suicide attempts: 2 before 2009 Hx of violent behavior towards others: denies Current access to guns: denies Hx of abuse: denies Military Hx: denies Hx of Seizures: denies Hx of TBI: denies   Previous Psychotropic Medications: Yes   Substance Abuse History in the last 12 months:  No.  Consequences of Substance Abuse: Negative  Past Medical History:  Past Medical History:  Diagnosis Date  . Acute maxillary sinusitis   . Adjustment disorder with mixed anxiety and depressed mood    hx cutting  . Allergic rhinitis   . Anemia   . Chest discomfort   . Dizziness   . Family history of endocrine disorder   . Family history of schizophrenia   . Family history of thyroid disease   . Fatigue   . HTN (hypertension), benign   . Mourning   . Muscle fasciculation   . OCD (obsessive compulsive disorder)   . Palpitations   . POTS (postural orthostatic tachycardia syndrome)   . SOB (shortness of breath)   . Tachycardia   . Vitamin D deficiency     Past Surgical History:  Procedure Laterality Date  . NO PAST SURGERIES      Family Psychiatric and Medical History:  Family History  Problem Relation Age of Onset  . Hyperlipidemia Mother   .  Asthma Sister   . Depression Sister   . Colon cancer Maternal Grandmother     Colon  . Diabetes Maternal Grandmother   . Schizophrenia Maternal Grandmother   . Miscarriages / Stillbirths Maternal Grandmother   . Varicose Veins Maternal Grandmother   . Diabetes Maternal Grandfather   . Hearing loss Maternal Grandfather   . Hyperlipidemia Maternal Grandfather   . Stroke Maternal Grandfather   . Cancer Paternal Grandfather     Esophageal Cancer  . Mental illness Sister     Social History:  Social History   Social History  . Marital status: Single    Spouse name: [name]  . Number of children: 0  . Years of  education: 68   Social History Main Topics  . Smoking status: Never Smoker  . Smokeless tobacco: Never Used  . Alcohol use No  . Drug use: No  . Sexual activity: No   Other Topics Concern  . None   Social History Narrative   Currently in college studying, online going part-time.  Working on 6 credits.   Lives at home with mom and her sister in [place].   Has one cat   Unemployed.    Single, no kids       Allergies:  Allergies  Allergen Reactions  . Penicillins Swelling    Has patient had a PCN reaction causing immediate rash, facial/tongue/throat swelling, SOB or lightheadedness with hypotension: Yes Has patient had a PCN reaction causing severe rash involving mucus membranes or skin necrosis: Yes Has patient had a PCN reaction that required hospitalization No. Has patient had a PCN reaction occurring within the last 10 years: No. If all of the above answers are "NO", then may proceed with Cephalosporin use.     Metabolic Disorder Labs: No results found for: HGBA1C, MPG No results found for: PROLACTIN Lab Results  Component Value Date   CHOL 206 (H) 11/03/2014   TRIG 111.0 11/03/2014   HDL 50.50 11/03/2014   CHOLHDL 4 11/03/2014   VLDL 22.2 11/03/2014   LDLCALC 133 (H) 11/03/2014   LDLCALC 75 09/11/2007     Current Medications: Current Outpatient Prescriptions  Medication Sig Dispense Refill  . ALPRAZolam (XANAX) 0.5 MG tablet Take 1 tablet (0.5 mg total) by mouth at bedtime as needed for anxiety. 30 tablet 2  . buPROPion (WELLBUTRIN XL) 150 MG 24 hr tablet Take 1 tablet (150 mg total) by mouth daily. 30 tablet 2  . calcium-vitamin D (OSCAL WITH D) 500-200 MG-UNIT tablet Take 1 tablet by mouth daily.    . cetirizine (ZYRTEC) 10 MG tablet Take 10 mg by mouth daily.    . Cholecalciferol (VITAMIN D3) 2000 UNITS TABS Take 2,000 Units by mouth daily.    . Multiple Vitamins-Minerals (MULTIVITAMIN ADULT PO) Take 1 tablet by mouth daily.    . Omega-3 Fatty Acids (OMEGA 3  PO) Take 1 tablet by mouth daily.    . QUEtiapine (SEROQUEL) 25 MG tablet Take 1 tablet (25 mg total) by mouth daily. (Patient not taking: Reported on 08/10/2016) 30 tablet 2   No current facility-administered medications for this visit.       Musculoskeletal: Strength & Muscle Tone: within normal limits Gait & Station: normal Patient leans: [name]  Psychiatric Specialty Exam: Review of Systems  Constitutional: Negative for chills, fever, malaise/fatigue and weight loss.  HENT: Negative for ear pain, sore throat and tinnitus.   Eyes: Negative for blurred vision, double vision, photophobia and pain.  Respiratory: Negative for cough,  shortness of breath and wheezing.   Cardiovascular: Positive for chest pain and palpitations. Negative for leg swelling.  Gastrointestinal: Negative for abdominal pain, diarrhea, heartburn, nausea and vomiting.  Musculoskeletal: Negative for back pain, joint pain, myalgias and neck pain.  Skin: Negative for itching and rash.  Neurological: Negative for dizziness, tremors, seizures, loss of consciousness and headaches.  Psychiatric/Behavioral: Negative for depression, hallucinations, substance abuse and suicidal ideas. The patient is nervous/anxious. The patient does not have insomnia.     Blood pressure 100/68, pulse 88, height 5\' 5"  (1.651 m), weight 116 lb 9.6 oz (52.9 kg).Body mass index is 19.4 kg/m.  General Appearance: Disheveled and Guarded  Eye Contact:  Good  Speech:  Clear and Coherent and Normal Rate  Volume:  Normal  Mood:  Anxious  Affect:  Congruent and Flat  Thought Process:  Coherent  Orientation:  Full (Time, Place, and Person)  Thought Content: Rumination   Suicidal Thoughts:  No  Homicidal Thoughts:  No  Memory:  Immediate;   Good Recent;   Fair Remote;   Fair  Judgement:  Poor  Insight:  Shallow  Psychomotor Activity:  Normal  Concentration:  Concentration: Good  Recall:  Good  Fund of Knowledge: Good  Language: Good   Akathisia:  No  Handed:  Right  AIMS (if indicated):  [name]  Assets:  Desire for Improvement Social Support  ADL's:  Intact  Cognition: WNL  Sleep:  fair   Treatment Plan Summary: Medication management and Plan see below  Assessment:  GAD; Social Anxiety disorder; Panic disorder with agoraphobia; OCD; MDD-recurrent, moderate   Medication management with supportive therapy. Risks/benefits and SE of the medication discussed. Pt verbalized understanding and verbal consent obtained for treatment.  Affirm with the patient that the medications are taken as ordered. Patient expressed understanding of how their medications were to be used.  Meds: Continue Wellbutrin XL 150mg  po qD for mood Continue Xanax 0.5mg  po qD prn anxiety Start Seroquel 25-50mg  po qHS for mood and anxiety Pt very fearful of having cardiac SE to any medication. She states she wants to improve her quality of life but has low motivation to do anything to change it right now.    Labs: Ekg Nov 18, 2015 QTc 415; EKG 06/30/2016 QTc 398 Glu 121, CBC wNL, TSH WNL    Therapy: brief supportive therapy provided. Discussed psychosocial stressors in detail.   Encouraged pt to develop daily routine and work on daily goal setting as a way to improve mood symptoms.   Consultations:  Encouraged to continue individual therapy  Pt denies SI and is at an acute low risk for suicide. Patient told to call clinic if any problems occur. Patient advised to go to ER if they should develop SI/HI, side effects, or if symptoms worsen. Has crisis numbers to call if needed. Pt verbalized understanding.  F/up in 2 months or sooner if needed     Charlcie Cradle, MD 08/10/2016, 1:10 PM

## 2016-08-14 ENCOUNTER — Encounter: Payer: Self-pay | Admitting: Internal Medicine

## 2016-08-14 ENCOUNTER — Ambulatory Visit (INDEPENDENT_AMBULATORY_CARE_PROVIDER_SITE_OTHER): Payer: BLUE CROSS/BLUE SHIELD | Admitting: Internal Medicine

## 2016-08-14 VITALS — BP 108/70 | HR 86 | Temp 98.1°F | Resp 16 | Ht 65.0 in | Wt 115.8 lb

## 2016-08-14 DIAGNOSIS — Z79899 Other long term (current) drug therapy: Secondary | ICD-10-CM | POA: Diagnosis not present

## 2016-08-14 DIAGNOSIS — R002 Palpitations: Secondary | ICD-10-CM | POA: Diagnosis not present

## 2016-08-14 DIAGNOSIS — F411 Generalized anxiety disorder: Secondary | ICD-10-CM

## 2016-08-14 DIAGNOSIS — F4010 Social phobia, unspecified: Secondary | ICD-10-CM

## 2016-08-14 NOTE — Patient Instructions (Signed)
ekg is still normal  At this time  Rate  In the 80s is within normal range  At this time.  We can get cardiology opinion but I don't see any  Significant increase risk   For arrhythmias  .

## 2016-08-14 NOTE — Progress Notes (Signed)
Chief Complaint  Patient presents with  . Heart Problem    possible referral, new medications    HPI: [name] 20 y.o. comes in for follow-up today with her mom. She was to start 25 mg of Seroquel per her psychiatrist to help her with her severe anxiety however she is still quite anxious about taking this and never started it. Her psychiatrist told her that if she doesn't try taking at then there is no need to come back under care because she cannot help her. [name] was concerned because she was to get an EKG because it can cause heart arrhythmias and since that time she has been very hesitant to take it. Would like reassurance that her heart is okay. Mom and sister is even offered to be with her when she takes the medicine. Sister has a behavioral health history and has done very well on 400+ milligrams of Seroquel. [name] has had a history of intermittent palpitations and racing type heart. Saw Dr. Harrington Challenger remotely. No diagnosis made. She is going to school online. Can sustain her room with her comfort Therapy cat ROS: See pertinent positives and negatives per HPI.  Past Medical History:  Diagnosis Date  . Acute maxillary sinusitis   . Adjustment disorder with mixed anxiety and depressed mood    hx cutting  . Allergic rhinitis   . Anemia   . Chest discomfort   . Dizziness   . Family history of endocrine disorder   . Family history of schizophrenia   . Family history of thyroid disease   . Fatigue   . HTN (hypertension), benign   . Mourning   . Muscle fasciculation   . OCD (obsessive compulsive disorder)   . Palpitations   . POTS (postural orthostatic tachycardia syndrome)   . SOB (shortness of breath)   . Tachycardia   . Vitamin D deficiency     Family History  Problem Relation Age of Onset  . Hyperlipidemia Mother   . Asthma Sister   . Depression Sister   . Colon cancer Maternal Grandmother     Colon  . Diabetes Maternal Grandmother   . Schizophrenia Maternal  Grandmother   . Miscarriages / Stillbirths Maternal Grandmother   . Varicose Veins Maternal Grandmother   . Diabetes Maternal Grandfather   . Hearing loss Maternal Grandfather   . Hyperlipidemia Maternal Grandfather   . Stroke Maternal Grandfather   . Cancer Paternal Grandfather     Esophageal Cancer  . Mental illness Sister     Social History   Social History  . Marital status: Single    Spouse name: N/A  . Number of children: 0  . Years of education: 29   Social History Main Topics  . Smoking status: Never Smoker  . Smokeless tobacco: Never Used  . Alcohol use No  . Drug use: No  . Sexual activity: No   Other Topics Concern  . None   Social History Narrative   Currently in college studying, online going part-time.  Working on 6 credits.   Lives at home with mom and her sister in [place].   Has one cat   Unemployed.    Single, no kids       Outpatient Medications Prior to Visit  Medication Sig Dispense Refill  . ALPRAZolam (XANAX) 0.5 MG tablet Take 1 tablet (0.5 mg total) by mouth at bedtime as needed for anxiety. 30 tablet 2  . buPROPion (WELLBUTRIN XL) 150 MG 24 hr tablet  Take 1 tablet (150 mg total) by mouth daily. 30 tablet 2  . calcium-vitamin D (OSCAL WITH D) 500-200 MG-UNIT tablet Take 1 tablet by mouth daily.    . cetirizine (ZYRTEC) 10 MG tablet Take 10 mg by mouth daily.    . Cholecalciferol (VITAMIN D3) 2000 UNITS TABS Take 2,000 Units by mouth daily.    . Multiple Vitamins-Minerals (MULTIVITAMIN ADULT PO) Take 1 tablet by mouth daily.    . Omega-3 Fatty Acids (OMEGA 3 PO) Take 1 tablet by mouth daily.    . QUEtiapine (SEROQUEL) 25 MG tablet Take 1 tablet (25 mg total) by mouth daily. (Patient not taking: Reported on 08/14/2016) 30 tablet 2   No facility-administered medications prior to visit.      EXAM:  BP 108/70   Pulse 86   Temp 98.1 F (36.7 C) (Oral)   Resp 16   Ht 5\' 5"  (1.651 m)   Wt 115 lb 12.8 oz (52.5 kg)   LMP 07/15/2016 (Within  Months)   SpO2 98%   BMI 19.27 kg/m   Body mass index is 19.27 kg/m.  GENERAL: vitals reviewed and listed above, alert, oriented, appears well hydrated and in no acute distress mild anxiety nl speech and eye contact  HEENT: atraumatic, conjunctiva  clear, no obvious abnormalities on inspection of external nose and ears  NECK: no obvious masses on inspection palpation  LUNGS: clear to auscultation bilaterally, no wheezes, rales or rhonchi, good air movement CV: HRRR, no clubbing cyanosis or  peripheral edema nl cap refill  No g or m heard   MS: moves all extremities without noticeable focal  abnormality PSYCH: pleasant and cooperative, mod anxious  EKG NSR nl intervals  ASSESSMENT AND PLAN:  Discussed the following assessment and plan:  Palpitations - Plan: EKG 12-Lead, Ambulatory referral to Cardiology  Medication management - Plan: EKG 12-Lead, Ambulatory referral to Cardiology  Social anxiety disorder - Plan: Ambulatory referral to Cardiology  Anxiety state - Plan: Ambulatory referral to Cardiology Patient extremely anxious that the medication could cause arrhythmia [place] as told by the psychiatrist and she reports that it would help to see cardiology to help reassure her that her heart is okay.  And then she can ask her questions about what the risk of arrhythmia is really about and how  Often this occurs of significance  I told her we can do a referral to cardiology prefers a female but the anxiety and cause more symptoms in the medicine most likely. It seems like she is cognitively aware her family supportive and her trying to take it. -Patient advised to return or notify health care team  if symptoms worsen ,persist or new concerns arise. Discussed staying hydrated and eating correctly. She is doing better with this. Patient Instructions  ekg is still normal  At this time  Rate  In the 80s is within normal range  At this time.  We can get cardiology opinion but I don't see any   Significant increase risk   For arrhythmias  .       Standley Brooking. Eoghan Belcher M.D.

## 2016-08-24 ENCOUNTER — Ambulatory Visit (INDEPENDENT_AMBULATORY_CARE_PROVIDER_SITE_OTHER): Payer: BLUE CROSS/BLUE SHIELD | Admitting: Family Medicine

## 2016-08-24 ENCOUNTER — Encounter: Payer: Self-pay | Admitting: Family Medicine

## 2016-08-24 VITALS — BP 108/80 | HR 96 | Temp 98.7°F | Wt 114.8 lb

## 2016-08-24 DIAGNOSIS — F411 Generalized anxiety disorder: Secondary | ICD-10-CM

## 2016-08-24 DIAGNOSIS — R002 Palpitations: Secondary | ICD-10-CM | POA: Diagnosis not present

## 2016-08-24 DIAGNOSIS — J069 Acute upper respiratory infection, unspecified: Secondary | ICD-10-CM | POA: Diagnosis not present

## 2016-08-24 NOTE — Patient Instructions (Signed)
Appear to have upper respiratory infection- this should resolve within 7-14 days from starting  That may be why you felt lightheaded as you were beginning to get sick.   I have no concerns about your lightheaded episode  I would get some rest and stay hydrated until you feel better

## 2016-08-24 NOTE — Progress Notes (Signed)
Subjective:  MERLIAH PLUCHINO is a 20 y.o. year old very pleasant female patient who presents for/with See problem oriented charting ROS- no fever, chills, nausea, vomiting. Mild runny nose, minimal sore throat.see any ROS included in HPI as well.   Past Medical History-  Patient Active Problem List   Diagnosis Date Noted  . GAD (generalized anxiety disorder) 05/30/2016  . Social anxiety disorder 05/30/2016  . Panic disorder with agoraphobia 05/30/2016  . MDD (major depressive disorder), recurrent episode, moderate ([place]) 05/30/2016  . OCD (obsessive compulsive disorder) 05/30/2016  . AP (abdominal pain) 11/08/2014  . Anemia, iron deficiency 11/08/2014  . HYPERTENSION, BENIGN 01/27/2011  . PALPITATIONS 09/02/2010  . SINUSITIS - ACUTE-NOS 01/19/2009  . FATIGUE 01/19/2009  . MOURNING 10/27/2008  . ADJ DISORDER WITH MIXED ANXIETY & DEPRESSED MOOD 05/21/2008  . ACUTE MAXILLARY SINUSITIS 02/24/2008  . SHORTNESS OF BREATH 10/24/2007  . UNSPECIFIED VITAMIN D DEFICIENCY 10/03/2007  . DIZZINESS 09/11/2007  . SYMPTOM, TACHYCARDIA NOS 09/11/2007  . MUSCLE FASCICULATIONS 07/31/2007  . CHEST DISCOMFORT, ATYPICAL 07/31/2007    Medications- reviewed and updated Current Outpatient Prescriptions  Medication Sig Dispense Refill  . ALPRAZolam (XANAX) 0.5 MG tablet Take 1 tablet (0.5 mg total) by mouth at bedtime as needed for anxiety. 30 tablet 2  . buPROPion (WELLBUTRIN XL) 150 MG 24 hr tablet Take 1 tablet (150 mg total) by mouth daily. 30 tablet 2  . calcium-vitamin D (OSCAL WITH D) 500-200 MG-UNIT tablet Take 1 tablet by mouth daily.    . cetirizine (ZYRTEC) 10 MG tablet Take 10 mg by mouth daily.    . Cholecalciferol (VITAMIN D3) 2000 UNITS TABS Take 2,000 Units by mouth daily.    . Multiple Vitamins-Minerals (MULTIVITAMIN ADULT PO) Take 1 tablet by mouth daily.    . Omega-3 Fatty Acids (OMEGA 3 PO) Take 1 tablet by mouth daily.    . QUEtiapine (SEROQUEL) 25 MG tablet Take 1 tablet (25 mg total)  by mouth daily. (Patient not taking: Reported on 08/24/2016) 30 tablet 2   No current facility-administered medications for this visit.     Objective: BP 108/80 (BP Location: Right Arm, Patient Position: Sitting, Cuff Size: Normal)   Pulse 96   Temp 98.7 F (37.1 C) (Oral)   Wt 114 lb 12.8 oz (52.1 kg)   LMP 08/14/2016 (Exact Date)   SpO2 97%   BMI 19.10 kg/m  Gen: NAD, resting comfortably TM largely normal- perhaps some increasedpressure on left, oropharynx normal, nares mildly erythematous CV: RRR no murmurs rubs or gallops Lungs: CTAB no crackles, wheeze, rhonchi Ext: no edema Skin: warm, dry Neuro: grossly normal, moves all extremities  Assessment/Plan:   Anxiety URI palpitations S:Transitioned from Dr. Toy Care to Dr. Doyne Keel. Plan was to transition from zoloft to seroquel. She has not started the seroquel but also stopped zoloft through wean. Last dose early august. She has been very nrevous about starting seroquel due to potential arhythmia risks- came back here for EKG with Dr. Regis Bill which was reassuring but ultimately referred to cardiology for further reassurance. She does have occasional palpitations with no other symptoms along wht them  Patient has slept with her mother the last 3 nights after Lightheaded on Monday for a second or two, some stuffy nose since then and some chest pain- far left side only when she lays on it. changes position goes away.  A/P: Extended counseling needed today for patient due to anxiety. Reassurance provided about seroquel- she has no prolonged QT and risk of qt  prolongation and issues for arythmia is low. Tachycardia or bradycardia have been [employer] well as palpitations but these would be low risk in patient her age. She still plans to see cardiology after reassurance it seems but may start seroquel before that time.   Also likely has URI- duration and conservative care discussed. A single 1-2 second lightheaded episode at beginning of URI  is not very concerning with her age and has not recurred.   The duration of face-to-face time during this visit was greater than 25 minutes. Greater than 50% of this time was spent in counseling as noted above.   Return precautions advised.  Garret Reddish, MD

## 2016-08-24 NOTE — Progress Notes (Signed)
Pre visit review using our clinic review tool, if applicable. No additional management support is needed unless otherwise documented below in the visit note. 

## 2016-08-25 ENCOUNTER — Ambulatory Visit (INDEPENDENT_AMBULATORY_CARE_PROVIDER_SITE_OTHER): Payer: BLUE CROSS/BLUE SHIELD | Admitting: Cardiovascular Disease

## 2016-08-25 ENCOUNTER — Encounter: Payer: Self-pay | Admitting: Cardiovascular Disease

## 2016-08-25 VITALS — BP 110/72 | HR 95 | Ht 65.0 in | Wt 115.6 lb

## 2016-08-25 DIAGNOSIS — F419 Anxiety disorder, unspecified: Secondary | ICD-10-CM | POA: Diagnosis not present

## 2016-08-25 DIAGNOSIS — R002 Palpitations: Secondary | ICD-10-CM | POA: Diagnosis not present

## 2016-08-25 NOTE — Patient Instructions (Signed)
Medication Instructions:  Your physician recommends that you continue on your current medications as directed. Please refer to the Current Medication list given to you today.  Labwork: none  Testing/Procedures: none  Follow-Up: Your physician recommends that you schedule a follow-up appointment in: 1 month ov  If you need a refill on your cardiac medications before your next appointment, please call your pharmacy.

## 2016-08-25 NOTE — Progress Notes (Signed)
Cardiology Office Note   Date:  08/25/2016   ID:  [name], DOB 04-Nov-1996, MRN [id number]  PCP:  Lottie Dawson, MD  Cardiologist:   Skeet Latch, MD   Chief Complaint  Patient presents with  . New Patient (Initial Visit)    Pt staes no Sx.     History of Present Illness: [name] is a 20 y.o. female with anxiety, panic disorder, hypertension and palpitations who presents for a second opinion.  [name] has severe anxiety and OCD.  She has been taking Zoloft for years and it was recently recommended that she switch to Seroquel.  However, she has been reluctant to do this because she thinks that her heart will stop beating overnight.  She Notes chest pain, palpitations, and shortness of breath when she has panic attacks. Otherwise she has no symptoms. She denies lower extremity edema, orthopnea, or PND. She occasionally notes that her heart feels like it's skipping a beat. This lasts for one second and there is no associated chest pain or lightheadedness. She does not drink caffeine or use over-the-counter medications.  She reports wearing a heart monitor several years ago and there were no abnormalities. [name] does not exercise. Her symptoms do not change with exertion.   Past Medical History:  Diagnosis Date  . Acute maxillary sinusitis   . Adjustment disorder with mixed anxiety and depressed mood    hx cutting  . Allergic rhinitis   . Anemia   . Chest discomfort   . Dizziness   . Family history of endocrine disorder   . Family history of schizophrenia   . Family history of thyroid disease   . Fatigue   . HTN (hypertension), benign   . Mourning   . Muscle fasciculation   . OCD (obsessive compulsive disorder)   . Palpitations   . POTS (postural orthostatic tachycardia syndrome)   . SOB (shortness of breath)   . Tachycardia   . Vitamin D deficiency     Past Surgical History:  Procedure Laterality Date  . NO PAST SURGERIES       Current  Outpatient Prescriptions  Medication Sig Dispense Refill  . ALPRAZolam (XANAX) 0.5 MG tablet Take 1 tablet (0.5 mg total) by mouth at bedtime as needed for anxiety. 30 tablet 2  . buPROPion (WELLBUTRIN XL) 150 MG 24 hr tablet Take 1 tablet (150 mg total) by mouth daily. 30 tablet 2  . calcium-vitamin D (OSCAL WITH D) 500-200 MG-UNIT tablet Take 1 tablet by mouth daily.    . cetirizine (ZYRTEC) 10 MG tablet Take 10 mg by mouth daily.    . Cholecalciferol (VITAMIN D3) 2000 UNITS TABS Take 2,000 Units by mouth daily.    . Multiple Vitamins-Minerals (MULTIVITAMIN ADULT PO) Take 1 tablet by mouth daily.    . Omega-3 Fatty Acids (OMEGA 3 PO) Take 1 tablet by mouth daily.    . QUEtiapine (SEROQUEL) 25 MG tablet Take 1 tablet (25 mg total) by mouth daily. (Patient not taking: Reported on 08/25/2016) 30 tablet 2   No current facility-administered medications for this visit.     Allergies:   Penicillins    Social History:  The patient  reports that she has never smoked. She has never used smokeless tobacco. She reports that she does not drink alcohol or use drugs.   Family History:  The patient's family history includes Asthma in her sister; Cancer in her paternal grandfather; Colon cancer in her maternal grandmother; Dementia  in her maternal grandfather; Depression in her sister; Diabetes in her maternal grandfather and maternal grandmother; Hearing loss in her maternal grandfather; Hyperlipidemia in her maternal grandfather and mother; Mental illness in her sister; Miscarriages / Stillbirths in her maternal grandmother; Schizophrenia in her maternal grandmother; Stroke in her maternal grandfather; Varicose Veins in her maternal grandmother.    ROS:  Please see the history of present illness.   Otherwise, review of systems are positive for none.   All other systems are reviewed and negative.    PHYSICAL EXAM: VS:  BP 110/72   Pulse 95   Ht 5\' 5"  (1.651 m)   Wt 115 lb 9.6 oz (52.4 kg)   LMP  08/14/2016 (Exact Date)   BMI 19.24 kg/m  , BMI Body mass index is 19.24 kg/m. GENERAL:  Well appearing HEENT:  Pupils equal round and reactive, fundi not visualized, oral mucosa unremarkable NECK:  No jugular venous distention, waveform within normal limits, carotid upstroke brisk and symmetric, no bruits, no thyromegaly LYMPHATICS:  No cervical adenopathy LUNGS:  Clear to auscultation bilaterally HEART:  RRR.  PMI not displaced or sustained,S1 and S2 within normal limits, no S3, no S4, no clicks, no rubs, no murmurs ABD:  Flat, positive bowel sounds normal in frequency in pitch, no bruits, no rebound, no guarding, no midline pulsatile mass, no hepatomegaly, no splenomegaly EXT:  2 plus pulses throughout, no edema, no cyanosis no clubbing SKIN:  No rashes no nodules NEURO:  Cranial nerves II through XII grossly intact, motor grossly intact throughout PSYCH:  Cognitively intact, oriented to person place and time    EKG:  EKG is not ordered today. The ekg ordered 08/14/16 demonstrates sinus rhythm.  Rate 08/14/16.  QTc 395 ms.     Recent Labs: 11/17/2015: BUN 7; Creatinine, Ser 0.80; Hemoglobin 14.8; Platelets 281; Potassium 3.7; Sodium 142; TSH 1.819    Lipid Panel    Component Value Date/Time   CHOL 206 (H) 11/03/2014 1505   TRIG 111.0 11/03/2014 1505   HDL 50.50 11/03/2014 1505   CHOLHDL 4 11/03/2014 1505   VLDL 22.2 11/03/2014 1505   LDLCALC 133 (H) 11/03/2014 1505      Wt Readings from Last 3 Encounters:  08/25/16 115 lb 9.6 oz (52.4 kg)  08/24/16 114 lb 12.8 oz (52.1 kg)  08/14/16 115 lb 12.8 oz (52.5 kg)      ASSESSMENT AND PLAN:  # Palpitations:  [name] has occasional palpitations that seem most consistent with either PACs or PVCs. There are very rare and there are no alarm symptoms. No further investigation is indicated at this time.   # Anxiety:  [name] anxiety is very poorly controlled. I have no reservations about her taking Zoloft for her anxiety. The  main concern with this medication would be QT prolongation. Her QT is completely within normal limits. She has no underlying heart disease and therefore this should be a safe medication for her. She expressed understanding. I did tell her that if she would like for [place] to see her in about a month after being on the medication just to ensure that her heart is okay, we would be happy to do so. She expressed appreciation and is now agreeable to taking the medication.    Current medicines are reviewed at length with the patient today.  The patient does not have concerns regarding medicines.  The following changes have been made:  no change  Labs/ tests ordered today include:  No orders of  the defined types were placed in this encounter.    Disposition:   FU with Emiel Kielty C. Oval Linsey, MD, [facility] in 1 month.     This note was written with the assistance of speech recognition software.  Please excuse any transcriptional errors.  Signed, Quadre Bristol C. Oval Linsey, MD, [facility]  08/25/2016 4:39 PM    [place]

## 2016-09-21 ENCOUNTER — Ambulatory Visit (HOSPITAL_COMMUNITY): Payer: Self-pay | Admitting: Psychiatry

## 2016-09-25 ENCOUNTER — Encounter: Payer: Self-pay | Admitting: Internal Medicine

## 2016-09-25 ENCOUNTER — Ambulatory Visit (INDEPENDENT_AMBULATORY_CARE_PROVIDER_SITE_OTHER): Payer: BLUE CROSS/BLUE SHIELD | Admitting: Internal Medicine

## 2016-09-25 VITALS — BP 90/60 | Temp 98.0°F | Wt 114.0 lb

## 2016-09-25 DIAGNOSIS — F411 Generalized anxiety disorder: Secondary | ICD-10-CM | POA: Diagnosis not present

## 2016-09-25 DIAGNOSIS — F4010 Social phobia, unspecified: Secondary | ICD-10-CM

## 2016-09-25 DIAGNOSIS — R0789 Other chest pain: Secondary | ICD-10-CM | POA: Diagnosis not present

## 2016-09-25 NOTE — Patient Instructions (Signed)
Have to decide  To take medication with support  And fu cards   Do not hink you have a serious cause of CP except  For anxiety

## 2016-09-25 NOTE — Progress Notes (Signed)
Pre visit review using our clinic review tool, if applicable. No additional management support is needed unless otherwise documented below in the visit note.  Chief Complaint  Patient presents with  . Follow-up    HPI: [name] 20 y.o. comes in today with her mom for follow-up of anxiety chest pain. She did see cardiology who felt there was nothing wrong with her heart but does have a follow-up but she hasn't taken the Seroquel because she is so anxious about it. Mom is trying to think of all ways to have her take the medicine because it really helped her sibling. Psychiatry has told her that if she doesn't take the medicine they can't help her. She is seeing her counselor for cognitive therapy every week. Mom does say she is tends to stay in her ringing but is doing her online courses. ROS: See pertinent positives and negatives per HPI.  Past Medical History:  Diagnosis Date  . Acute maxillary sinusitis   . Adjustment disorder with mixed anxiety and depressed mood    hx cutting  . Allergic rhinitis   . Anemia   . Chest discomfort   . Dizziness   . Family history of endocrine disorder   . Family history of schizophrenia   . Family history of thyroid disease   . Fatigue   . HTN (hypertension), benign   . Mourning   . Muscle fasciculation   . OCD (obsessive compulsive disorder)   . Palpitations   . POTS (postural orthostatic tachycardia syndrome)   . SOB (shortness of breath)   . Tachycardia   . Vitamin D deficiency     Family History  Problem Relation Age of Onset  . Hyperlipidemia Mother   . Asthma Sister   . Depression Sister   . Colon cancer Maternal Grandmother     Colon  . Diabetes Maternal Grandmother   . Schizophrenia Maternal Grandmother   . Miscarriages / Stillbirths Maternal Grandmother   . Varicose Veins Maternal Grandmother   . Diabetes Maternal Grandfather   . Hearing loss Maternal Grandfather   . Hyperlipidemia Maternal Grandfather   . Stroke  Maternal Grandfather   . Dementia Maternal Grandfather   . Cancer Paternal Grandfather     Esophageal Cancer  . Mental illness Sister     Social History   Social History  . Marital status: Single    Spouse name: N/A  . Number of children: 0  . Years of education: 83   Social History Main Topics  . Smoking status: Never Smoker  . Smokeless tobacco: Never Used  . Alcohol use No  . Drug use: No  . Sexual activity: No   Other Topics Concern  . None   Social History Narrative   Currently in college studying, online going part-time.  Working on 6 credits.   Lives at home with mom and her sister in [place].   Has one cat   Unemployed.    Single, no kids       Outpatient Medications Prior to Visit  Medication Sig Dispense Refill  . ALPRAZolam (XANAX) 0.5 MG tablet Take 1 tablet (0.5 mg total) by mouth at bedtime as needed for anxiety. 30 tablet 2  . buPROPion (WELLBUTRIN XL) 150 MG 24 hr tablet Take 1 tablet (150 mg total) by mouth daily. 30 tablet 2  . calcium-vitamin D (OSCAL WITH D) 500-200 MG-UNIT tablet Take 1 tablet by mouth daily.    . cetirizine (ZYRTEC) 10 MG tablet Take 10  mg by mouth daily.    . Multiple Vitamins-Minerals (MULTIVITAMIN ADULT PO) Take 1 tablet by mouth daily.    . Omega-3 Fatty Acids (OMEGA 3 PO) Take 1 tablet by mouth daily.    . QUEtiapine (SEROQUEL) 25 MG tablet Take 1 tablet (25 mg total) by mouth daily. (Patient not taking: Reported on 09/25/2016) 30 tablet 2  . Cholecalciferol (VITAMIN D3) 2000 UNITS TABS Take 2,000 Units by mouth daily.     No facility-administered medications prior to visit.      EXAM:  BP 90/60 (BP Location: Right Arm, Patient Position: Sitting, Cuff Size: Normal)   Temp 98 F (36.7 C) (Oral)   Wt 114 lb (51.7 kg)   BMI 18.97 kg/m   Body mass index is 18.97 kg/m.  GENERAL: vitals reviewed and listed above, alert, oriented, appears well hydrated and in no acute distress slender  Interactive    HEENT: atraumatic,  conjunctiva  clear, no obvious abnormalities on inspection of external nose and ears  NECK: no obvious masses on inspection palpation  LUNGS: clear to auscultation bilaterally, no wheezes, rales or rhonchi, good air movement CV: HRRR, no clubbing cyanosis or  peripheral edema nl cap refill  s1 s2 nl  Slender  Habitus  MS: moves all extremities without noticeable focal  abnormality PSYCH: pleasant and cooperative, anxiety but verbal  Wt Readings from Last 3 Encounters:  09/25/16 114 lb (51.7 kg)  08/25/16 115 lb 9.6 oz (52.4 kg)  08/24/16 114 lb 12.8 oz (52.1 kg)    ASSESSMENT AND PLAN:  Discussed the following assessment and plan:  Anxiety state - paralyzine disc about se anxiety   Other chest pain - poss cw  not cardiac reassurance but  anxiety will make sx worse   Social anxiety disorder Total visit 54mins > 50% spent counseling and coordinating care as indicated in above note and in instructions to patient .  Exam reassuring. Cassundra has to decide that she wants to take a big step take the medicine advice distraction continue her counseling I don't think other is worthwhile to investigate more with her chest pain that can make anxiety worse. -Patient advised to return or notify health care team  if symptoms worsen ,persist or new concerns arise.  Patient Instructions  Have to decide  To take medication with support  And fu cards   Do not hink you have a serious cause of CP except  For anxiety   Meghan Tiemann K. Haruye Lainez M.D.

## 2016-09-29 ENCOUNTER — Ambulatory Visit (INDEPENDENT_AMBULATORY_CARE_PROVIDER_SITE_OTHER): Payer: BLUE CROSS/BLUE SHIELD | Admitting: Cardiovascular Disease

## 2016-09-29 ENCOUNTER — Encounter: Payer: Self-pay | Admitting: Cardiovascular Disease

## 2016-09-29 VITALS — BP 114/72 | HR 102 | Ht 65.0 in | Wt 113.4 lb

## 2016-09-29 DIAGNOSIS — R0789 Other chest pain: Secondary | ICD-10-CM | POA: Diagnosis not present

## 2016-09-29 DIAGNOSIS — F419 Anxiety disorder, unspecified: Secondary | ICD-10-CM | POA: Diagnosis not present

## 2016-09-29 DIAGNOSIS — R079 Chest pain, unspecified: Secondary | ICD-10-CM

## 2016-09-29 DIAGNOSIS — R002 Palpitations: Secondary | ICD-10-CM

## 2016-09-29 NOTE — Progress Notes (Signed)
Cardiology Office Note   Date:  09/29/2016   ID:  [name], DOB 1996/08/01, MRN [id number]  PCP:  Lottie Dawson, MD  Cardiologist:   Skeet Latch, MD   Chief Complaint  Patient presents with  . Follow-up    skipped beats, chest pain    History of Present Illness: [name] is a 20 y.o. female with anxiety, panic disorder, hypertension and palpitations who presents for follow up.  [name] has severe anxiety and OCD.  Her psychiatrist recommended that she switch from Zoloft to Seroquel but she was reluctant to do this because she thinks that her heart will stop beating overnight.  She reports chest pain and shortness of breath in the setting of panic attacks.  She was advised that she was safe to take Seroquel at her last appointment but did not start it.  She had an episode of chest pain and followed up with her PCP, [name], who convinced her to take it.  She notes that her palpitations were better for the two days that she took the medicine.  She has noted increased palpitations in the two days since stopping it.  She denies lower extremity edema, orthopnea or PND.  Past Medical History:  Diagnosis Date  . Acute maxillary sinusitis   . Adjustment disorder with mixed anxiety and depressed mood    hx cutting  . Allergic rhinitis   . Anemia   . Chest discomfort   . Dizziness   . Family history of endocrine disorder   . Family history of schizophrenia   . Family history of thyroid disease   . Fatigue   . HTN (hypertension), benign   . Mourning   . Muscle fasciculation   . OCD (obsessive compulsive disorder)   . Palpitations   . POTS (postural orthostatic tachycardia syndrome)   . SOB (shortness of breath)   . Tachycardia   . Vitamin D deficiency     Past Surgical History:  Procedure Laterality Date  . NO PAST SURGERIES       Current Outpatient Prescriptions  Medication Sig Dispense Refill  . ALPRAZolam (XANAX) 0.5 MG tablet Take 1 tablet (0.5  mg total) by mouth at bedtime as needed for anxiety. 30 tablet 2  . buPROPion (WELLBUTRIN XL) 150 MG 24 hr tablet Take 1 tablet (150 mg total) by mouth daily. 30 tablet 2  . calcium-vitamin D (OSCAL WITH D) 500-200 MG-UNIT tablet Take 1 tablet by mouth daily.    . cetirizine (ZYRTEC) 10 MG tablet Take 10 mg by mouth daily.    . Multiple Vitamins-Minerals (MULTIVITAMIN ADULT PO) Take 1 tablet by mouth daily.    . Omega-3 Fatty Acids (OMEGA 3 PO) Take 1 tablet by mouth daily.    . QUEtiapine (SEROQUEL) 25 MG tablet Take 1 tablet (25 mg total) by mouth daily. 30 tablet 2   No current facility-administered medications for this visit.     Allergies:   Penicillins    Social History:  The patient  reports that she has never smoked. She has never used smokeless tobacco. She reports that she does not drink alcohol or use drugs.   Family History:  The patient's family history includes Asthma in her sister; Cancer in her paternal grandfather; Colon cancer in her maternal grandmother; Dementia in her maternal grandfather; Depression in her sister; Diabetes in her maternal grandfather and maternal grandmother; Hearing loss in her maternal grandfather; Hyperlipidemia in her maternal grandfather and mother; Mental illness  in her sister; Miscarriages / Stillbirths in her maternal grandmother; Schizophrenia in her maternal grandmother; Stroke in her maternal grandfather; Varicose Veins in her maternal grandmother.    ROS:  Please see the history of present illness.   Otherwise, review of systems are positive for none.   All other systems are reviewed and negative.    PHYSICAL EXAM: VS:  BP 114/72   Pulse (!) 102   Ht 5\' 5"  (1.651 m)   Wt 51.4 kg (113 lb 6.4 oz)   BMI 18.87 kg/m  , BMI Body mass index is 18.87 kg/m. GENERAL:  Well appearing.  Very anxious.  HEENT:  Pupils equal round and reactive, fundi not visualized, oral mucosa unremarkable NECK:  N o jugular venous distention, waveform within  normal limits, carotid upstroke brisk and symmetric, no bruits LYMPHATICS:  No cervical adenopathy LUNGS:  Clear to auscultation bilaterally HEART:  RRR.  PMI not displaced or sustained,S1 and S2 within normal limits, no S3, no S4, no clicks, no rubs, no murmurs ABD:  Flat, positive bowel sounds normal in frequency in pitch, no bruits, no rebound, no guarding, no midline pulsatile mass, no hepatomegaly, no splenomegaly EXT:  2 plus pulses throughout, no edema, no cyanosis no clubbing SKIN:  No rashes no nodules NEURO:  Cranial nerves II through XII grossly intact, motor grossly intact throughout PSYCH:  Cognitively intact, oriented to person place and time   EKG:  EKG is ordered today. 09/29/16: Sinus tachycardia.  Rate 102 bpm.  Non-specific ST-T changes.   Recent Labs: 11/17/2015: BUN 7; Creatinine, Ser 0.80; Hemoglobin 14.8; Platelets 281; Potassium 3.7; Sodium 142; TSH 1.819    Lipid Panel    Component Value Date/Time   CHOL 206 (H) 11/03/2014 1505   TRIG 111.0 11/03/2014 1505   HDL 50.50 11/03/2014 1505   CHOLHDL 4 11/03/2014 1505   VLDL 22.2 11/03/2014 1505   LDLCALC 133 (H) 11/03/2014 1505      Wt Readings from Last 3 Encounters:  09/29/16 51.4 kg (113 lb 6.4 oz)  09/25/16 51.7 kg (114 lb)  08/25/16 52.4 kg (115 lb 9.6 oz)      ASSESSMENT AND PLAN:  # Palpitations:  [name] has occasional palpitations that seem most consistent with either PACs or PVCs. There are very rare and there are no alarm symptoms. No further investigation is indicated at this time.   # Anxiety:  [name] anxiety is very poorly controlled. We again discussed the importance of starting Seroquel.  Her QTC is 396 and this is a safe medication.  Given that she has chest pain at times, we will get an echo to ensure that her heart is OK after starting this medication.     Current medicines are reviewed at length with the patient today.  The patient does not have concerns regarding  medicines.  The following changes have been made:  no change  Labs/ tests ordered today include:   Orders Placed This Encounter  Procedures  . EKG 12-Lead  . ECHOCARDIOGRAM COMPLETE   Time spent: 30 minutes-Greater than 50% of this time was spent in counseling, explanation of diagnosis, planning of further management, and coordination of care.   Disposition:   FU with Janet Vora C. Oval Linsey, MD, [facility] in 3 months.     This note was written with the assistance of speech recognition software.  Please excuse any transcriptional errors.  Signed, Travonta Gill C. Oval Linsey, MD, [facility]  09/29/2016 4:49 PM    [facility]

## 2016-09-29 NOTE — Patient Instructions (Addendum)
Medication Instructions:  Your physician recommends that you continue on your current medications as directed. Please refer to the Current Medication list given to you today.  Labwork: none  Testing/Procedures: Your physician has requested that you have an echocardiogram. Echocardiography is a painless test that uses sound waves to create images of your heart. It provides your doctor with information about the size and shape of your heart and how well your heart's chambers and valves are working. This procedure takes approximately one hour. There are no restrictions for this procedure. [address]   Follow-Up: Your physician recommends that you schedule a follow-up appointment in: 3 month ov  If you need a refill on your cardiac medications before your next appointment, please call your pharmacy.

## 2016-10-30 ENCOUNTER — Other Ambulatory Visit: Payer: Self-pay

## 2016-10-30 ENCOUNTER — Ambulatory Visit (HOSPITAL_COMMUNITY): Payer: BLUE CROSS/BLUE SHIELD | Attending: Internal Medicine

## 2016-10-30 DIAGNOSIS — R079 Chest pain, unspecified: Secondary | ICD-10-CM | POA: Insufficient documentation

## 2016-11-10 ENCOUNTER — Ambulatory Visit (INDEPENDENT_AMBULATORY_CARE_PROVIDER_SITE_OTHER): Payer: BLUE CROSS/BLUE SHIELD | Admitting: Family Medicine

## 2016-11-10 VITALS — BP 106/78 | HR 120 | Temp 99.8°F | Resp 16 | Ht 65.0 in | Wt 122.0 lb

## 2016-11-10 DIAGNOSIS — J069 Acute upper respiratory infection, unspecified: Secondary | ICD-10-CM | POA: Diagnosis not present

## 2016-11-10 DIAGNOSIS — E860 Dehydration: Secondary | ICD-10-CM | POA: Diagnosis not present

## 2016-11-10 DIAGNOSIS — L2089 Other atopic dermatitis: Secondary | ICD-10-CM | POA: Diagnosis not present

## 2016-11-10 MED ORDER — TRIAMCINOLONE ACETONIDE 0.1 % EX CREA: 1.0000 "application " | TOPICAL_CREAM | Freq: Two times a day (BID) | CUTANEOUS | 0 refills | Status: DC

## 2016-11-10 MED ORDER — OSELTAMIVIR PHOSPHATE 75 MG PO CAPS: 75.0000 mg | ORAL_CAPSULE | Freq: Two times a day (BID) | ORAL | 0 refills | Status: DC

## 2016-11-10 MED ORDER — MUCINEX DM MAXIMUM STRENGTH 60-1200 MG PO TB12
1.0000 | ORAL_TABLET | Freq: Two times a day (BID) | ORAL | 1 refills | Status: DC
Start: 2016-11-10 — End: 2016-12-21

## 2016-11-10 NOTE — Patient Instructions (Addendum)
IF you received an x-ray today, you will receive an invoice from [facility]. Please contact [facility] at [phone] with questions or concerns regarding your invoice.   IF you received labwork today, you will receive an invoice from [facility]. Please contact [facility] at [phone] with questions or concerns regarding your invoice.   Our billing staff will not be able to assist you with questions regarding bills from these companies.  You will be contacted with the lab results as soon as they are available. The fastest way to get your results is to activate your My Chart account. Instructions are located on the last page of this paperwork. If you have not heard from [place] regarding the results in 2 weeks, please contact this office.     Dehydration, Adult Dehydration is a condition in which there is not enough fluid or water in the body. This happens when you lose more fluids than you take in. Important organs, such as the kidneys, brain, and heart, cannot function without a proper amount of fluids. Any loss of fluids from the body can lead to dehydration. Dehydration can range from mild to severe. This condition should be treated right away to prevent it from becoming severe. What are the causes? This condition may be caused by:  Vomiting.  Diarrhea.  Excessive sweating, such as from heat exposure or exercise.  Not drinking enough fluid, especially:  When ill.  While doing activity that requires a lot of energy.  Excessive urination.  Fever.  Infection.  Certain medicines, such as medicines that cause the body to lose excess fluid (diuretics).  Inability to access safe drinking water.  Reduced physical ability to get adequate water and food. What increases the risk? This condition is more likely to develop in people:  Who have a poorly controlled long-term (chronic) illness, such as diabetes, heart disease, or kidney disease.  Who are age 20 or  older.  Who are disabled.  Who live in a place with high altitude.  Who play endurance sports. What are the signs or symptoms? Symptoms of mild dehydration may include:  Thirst.  Dry lips.  Slightly dry mouth.  Dry, warm skin.  Dizziness. Symptoms of moderate dehydration may include:  Very dry mouth.  Muscle cramps.  Dark urine. Urine may be the color of tea.  Decreased urine production.  Decreased tear production.  Heartbeat that is irregular or faster than normal (palpitations).  Headache.  Light-headedness, especially when you stand up from a sitting position.  Fainting (syncope). Symptoms of severe dehydration may include:  Changes in skin, such as:  Cold and clammy skin.  Blotchy (mottled) or pale skin.  Skin that does not quickly return to normal after being lightly pinched and released (poor skin turgor).  Changes in body fluids, such as:  Extreme thirst.  No tear production.  Inability to sweat when body temperature is high, such as in hot weather.  Very little urine production.  Changes in vital signs, such as:  Weak pulse.  Pulse that is more than 100 beats a minute when sitting still.  Rapid breathing.  Low blood pressure.  Other changes, such as:  Sunken eyes.  Cold hands and feet.  Confusion.  Lack of energy (lethargy).  Difficulty waking up from sleep.  Short-term weight loss.  Unconsciousness. How is this diagnosed? This condition is diagnosed based on your symptoms and a physical exam. Blood and urine tests may be done to help confirm the diagnosis. How is  this treated? Treatment for this condition depends on the severity. Mild or moderate dehydration can often be treated at home. Treatment should be started right away. Do not wait until dehydration becomes severe. Severe dehydration is an emergency and it needs to be treated in a hospital. Treatment for mild dehydration may include:  Drinking more  fluids.  Replacing salts and minerals in your blood (electrolytes) that you may have lost. Treatment for moderate dehydration may include:  Drinking an oral rehydration solution (ORS). This is a drink that helps you replace fluids and electrolytes (rehydrate). It can be found at pharmacies and retail stores. Treatment for severe dehydration may include:  Receiving fluids through an IV tube.  Receiving an electrolyte solution through a feeding tube that is passed through your nose and into your stomach (nasogastric tube, or NG tube).  Correcting any abnormalities in electrolytes.  Treating the underlying cause of dehydration. Follow these instructions at home:  If directed by your health care provider, drink an ORS:  Make an ORS by following instructions on the package.  Start by drinking small amounts, about  cup (120 mL) every 5-10 minutes.  Slowly increase how much you drink until you have taken the amount recommended by your health care provider.  Drink enough clear fluid to keep your urine clear or pale yellow. If you were told to drink an ORS, finish the ORS first, then start slowly drinking other clear fluids. Drink fluids such as:  Water. Do not drink only water. Doing that can lead to having too little salt (sodium) in the body (hyponatremia).  Ice chips.  Fruit juice that you have added water to (diluted fruit juice).  Low-calorie sports drinks.  Avoid:  Alcohol.  Drinks that contain a lot of sugar. These include high-calorie sports drinks, fruit juice that is not diluted, and soda.  Caffeine.  Foods that are greasy or contain a lot of fat or sugar.  Take over-the-counter and prescription medicines only as told by your health care provider.  Do not take sodium tablets. This can lead to having too much sodium in the body (hypernatremia).  Eat foods that contain a healthy balance of electrolytes, such as bananas, oranges, potatoes, tomatoes, and  spinach.  Keep all follow-up visits as told by your health care provider. This is important. Contact a health care provider if:  You have abdominal pain that:  Gets worse.  Stays in one area (localizes).  You have a rash.  You have a stiff neck.  You are more irritable than usual.  You are sleepier or more difficult to wake up than usual.  You feel weak or dizzy.  You feel very thirsty.  You have urinated only a small amount of very dark urine over 6-8 hours. Get help right away if:  You have symptoms of severe dehydration.  You cannot drink fluids without vomiting.  Your symptoms get worse with treatment.  You have a fever.  You have a severe headache.  You have vomiting or diarrhea that:  Gets worse.  Does not go away.  You have blood or green matter (bile) in your vomit.  You have blood in your stool. This may cause stool to look black and tarry.  You have not urinated in 6-8 hours.  You faint.  Your heart rate while sitting still is over 100 beats a minute.  You have trouble breathing. This information is not intended to replace advice given to you by your health care provider. Make sure  you discuss any questions you have with your health care provider. Document Released: 10/30/2005 Document Revised: 05/26/2016 Document Reviewed: 12/24/2015 Elsevier Interactive Patient Education  2017 [place].  Influenza, Adult Influenza, more commonly known as "the flu," is a viral infection that primarily affects the respiratory tract. The respiratory tract includes organs that help you breathe, such as the lungs, nose, and throat. The flu causes many common cold symptoms, as well as a high fever and body aches. The flu spreads easily from person to person (is contagious). Getting a flu shot (influenza vaccination) every year is the best way to prevent influenza. What are the causes? Influenza is caused by a virus. You can catch the virus by:  Breathing in  droplets from an infected person's cough or sneeze.  Touching something that was recently contaminated with the virus and then touching your mouth, nose, or eyes. What increases the risk? The following factors may make you more likely to get the flu:  Not cleaning your hands frequently with soap and water or alcohol-based hand sanitizer.  Having close contact with many people during cold and flu season.  Touching your mouth, eyes, or nose without washing or sanitizing your hands first.  Not drinking enough fluids or not eating a healthy diet.  Not getting enough sleep or exercise.  Being under a high amount of stress.  Not getting a yearly (annual) flu shot. You may be at a higher risk of complications from the flu, such as a severe lung infection (pneumonia), if you:  Are over the age of 76.  Are pregnant.  Have a weakened disease-fighting system (immune system). You may have a weakened immune system if you:  Have HIV or AIDS.  Are undergoing chemotherapy.  Aretaking medicines that reduce the activity of (suppress) the immune system.  Have a long-term (chronic) illness, such as heart disease, kidney disease, diabetes, or lung disease.  Have a liver disorder.  Are obese.  Have anemia. What are the signs or symptoms? Symptoms of this condition typically last 4-10 days and may include:  Fever.  Chills.  Headache, body aches, or muscle aches.  Sore throat.  Cough.  Runny or congested nose.  Chest discomfort and cough.  Poor appetite.  Weakness or tiredness (fatigue).  Dizziness.  Nausea or vomiting. How is this diagnosed? This condition may be diagnosed based on your medical history and a physical exam. Your health care provider may do a nose or throat swab test to confirm the diagnosis. How is this treated? If influenza is detected early, you can be treated with antiviral medicine that can reduce the length of your illness and the severity of your  symptoms. This medicine may be given by mouth (orally) or through an IV tube that is inserted in one of your veins. The goal of treatment is to relieve symptoms by taking care of yourself at home. This may include taking over-the-counter medicines, drinking plenty of fluids, and adding humidity to the air in your home. In some cases, influenza goes away on its own. Severe influenza or complications from influenza may be treated in a hospital. Follow these instructions at home:  Take over-the-counter and prescription medicines only as told by your health care provider.  Use a cool mist humidifier to add humidity to the air in your home. This can make breathing easier.  Rest as needed.  Drink enough fluid to keep your urine clear or pale yellow.  Cover your mouth and nose when you cough or  sneeze.  Wash your hands with soap and water often, especially after you cough or sneeze. If soap and water are not available, use hand sanitizer.  Stay home from work or school as told by your health care provider. Unless you are visiting your health care provider, try to avoid leaving home until your fever has been gone for 24 hours without the use of medicine.  Keep all follow-up visits as told by your health care provider. This is important. How is this prevented?  Getting an annual flu shot is the best way to avoid getting the flu. You may get the flu shot in late summer, fall, or winter. Ask your health care provider when you should get your flu shot.  Wash your hands often or use hand sanitizer often.  Avoid contact with people who are sick during cold and flu season.  Eat a healthy diet, drink plenty of fluids, get enough sleep, and exercise regularly. Contact a health care provider if:  You develop new symptoms.  You have:  Chest pain.  Diarrhea.  A fever.  Your cough gets worse.  You produce more mucus.  You feel nauseous or you vomit. Get help right away if:  You develop  shortness of breath or difficulty breathing.  Your skin or nails turn a bluish color.  You have severe pain or stiffness in your neck.  You develop a sudden headache or sudden pain in your face or ear.  You cannot stop vomiting. This information is not intended to replace advice given to you by your health care provider. Make sure you discuss any questions you have with your health care provider. Document Released: 10/27/2000 Document Revised: 04/06/2016 Document Reviewed: 08/24/2015 Elsevier Interactive Patient Education  2017 [employer].

## 2016-11-10 NOTE — Progress Notes (Signed)
By signing my name below, I, Mesha Guinyard, attest that this documentation has been prepared under the direction and in the presence of Delman Cheadle, MD.  Electronically Signed: Verlee Monte, Medical Scribe. 11/10/16. 5:32 PM.  Subjective:    Patient ID: [name], female    DOB: 1996-04-02, 20 y.o.   MRN: [id number]  HPI Chief Complaint  Patient presents with  . Sinusitis  . Sore Throat  . Headache  . Eczema    HPI Comments: [name] is a 20 y.o. female who presents to the [facility] complaining of sore throat, and cough onset 2 days ago. Reports associated sxs of fevers, congestion, myalgias yesterday, back pain, HA yesterday, facial pain yesterday, fatigue, and rhinorrhea. She has not tried any medication for relief to her sxs. Her mom who has a virus is her only sick contact and she hasn't got her flu shot this year. Denies chills, n/v/d, and loss of appetite.  Eczema: Located on her hands. She hasn't tried anything for her eczema.  Patient Active Problem List   Diagnosis Date Noted  . GAD (generalized anxiety disorder) 05/30/2016  . Social anxiety disorder 05/30/2016  . Panic disorder with agoraphobia 05/30/2016  . MDD (major depressive disorder), recurrent episode, moderate ([place]) 05/30/2016  . OCD (obsessive compulsive disorder) 05/30/2016  . AP (abdominal pain) 11/08/2014  . Anemia, iron deficiency 11/08/2014  . HYPERTENSION, BENIGN 01/27/2011  . PALPITATIONS 09/02/2010  . SINUSITIS - ACUTE-NOS 01/19/2009  . FATIGUE 01/19/2009  . MOURNING 10/27/2008  . ADJ DISORDER WITH MIXED ANXIETY & DEPRESSED MOOD 05/21/2008  . ACUTE MAXILLARY SINUSITIS 02/24/2008  . SHORTNESS OF BREATH 10/24/2007  . UNSPECIFIED VITAMIN D DEFICIENCY 10/03/2007  . DIZZINESS 09/11/2007  . SYMPTOM, TACHYCARDIA NOS 09/11/2007  . MUSCLE FASCICULATIONS 07/31/2007  . CHEST DISCOMFORT, ATYPICAL 07/31/2007   Past Medical History:  Diagnosis Date  . Acute maxillary sinusitis    . Adjustment disorder with mixed anxiety and depressed mood    hx cutting  . Allergic rhinitis   . Anemia   . Chest discomfort   . Dizziness   . Family history of endocrine disorder   . Family history of schizophrenia   . Family history of thyroid disease   . Fatigue   . HTN (hypertension), benign   . Mourning   . Muscle fasciculation   . OCD (obsessive compulsive disorder)   . Palpitations   . POTS (postural orthostatic tachycardia syndrome)   . SOB (shortness of breath)   . Tachycardia   . Vitamin D deficiency    Past Surgical History:  Procedure Laterality Date  . NO PAST SURGERIES     Allergies  Allergen Reactions  . Penicillins Swelling    Has patient had a PCN reaction causing immediate rash, facial/tongue/throat swelling, SOB or lightheadedness with hypotension: Yes Has patient had a PCN reaction causing severe rash involving mucus membranes or skin necrosis: Yes Has patient had a PCN reaction that required hospitalization No. Has patient had a PCN reaction occurring within the last 10 years: No. If all of the above answers are "NO", then may proceed with Cephalosporin use.    Prior to Admission medications   Medication Sig Start Date End Date Taking? Authorizing Provider  ALPRAZolam Duanne Moron) 0.5 MG tablet Take 1 tablet (0.5 mg total) by mouth at bedtime as needed for anxiety. 05/30/16  Yes Charlcie Cradle, MD  buPROPion (WELLBUTRIN XL) 150 MG 24 hr tablet Take 1 tablet (150 mg total) by  mouth daily. 08/10/16  Yes Charlcie Cradle, MD  calcium-vitamin D (OSCAL WITH D) 500-200 MG-UNIT tablet Take 1 tablet by mouth daily.   Yes Historical Provider, MD  cetirizine (ZYRTEC) 10 MG tablet Take 10 mg by mouth daily.   Yes Historical Provider, MD  Multiple Vitamins-Minerals (MULTIVITAMIN ADULT PO) Take 1 tablet by mouth daily.   Yes Historical Provider, MD  Omega-3 Fatty Acids (OMEGA 3 PO) Take 1 tablet by mouth daily.   Yes Historical Provider, MD  QUEtiapine (SEROQUEL) 25 MG  tablet Take 1 tablet (25 mg total) by mouth daily. 07/21/16 07/21/17 Yes Charlcie Cradle, MD   Social History   Social History  . Marital status: Single    Spouse name: N/A  . Number of children: 0  . Years of education: 12   Occupational History  . Not on file.   Social History Main Topics  . Smoking status: Never Smoker  . Smokeless tobacco: Never Used  . Alcohol use No  . Drug use: No  . Sexual activity: No   Other Topics Concern  . Not on file   Social History Narrative   Currently in college studying, online going part-time.  Working on 6 credits.   Lives at home with mom and her sister in [place].   Has one cat   Unemployed.    Single, no kids      Review of Systems  Constitutional: Positive for fatigue and fever. Negative for appetite change and chills.  HENT: Positive for congestion, rhinorrhea, sinus pain and sore throat.   Respiratory: Positive for cough.   Gastrointestinal: Negative for diarrhea, nausea and vomiting.  Musculoskeletal: Positive for back pain and myalgias.  Skin: Positive for rash (eczema).  Neurological: Positive for headaches.   Objective:  Physical Exam  Constitutional: She appears well-developed and well-nourished. No distress.  HENT:  Head: Normocephalic and atraumatic.  Mouth/Throat: Posterior oropharyngeal erythema present.  Effusion in the left TM Oropharynx dry  Eyes: Conjunctivae are normal.  Neck: Neck supple. No thyroid mass and no thyromegaly present.  Cardiovascular: Regular rhythm, S1 normal, S2 normal and normal heart sounds.  Tachycardia present.  Exam reveals no gallop and no friction rub.   No murmur heard. Pulmonary/Chest: Effort normal and breath sounds normal. No respiratory distress. She has no wheezes. She has no rales.  Lymphadenopathy:    She has cervical adenopathy (anterior).       Right cervical: Posterior cervical (minimal) adenopathy present.       Left cervical: Posterior cervical (minimal) adenopathy present.    Neurological: She is alert.  Skin: Skin is warm and dry. Rash noted.  Dorsum of her hands- red, dry, flaking Cracked over her knuckles Very distinct cut off to nl skin at her wrist Palm of hands are nl  Psychiatric: She has a normal mood and affect. Her behavior is normal.  Nursing note and vitals reviewed.  BP 106/78   Pulse (!) 120   Temp 99.8 F (37.7 C) (Oral)   Resp 16   Ht 5\' 5"  (1.651 m)   Wt 122 lb (55.3 kg)   LMP 10/04/2016   SpO2 99%   BMI 20.30 kg/m  Assessment & Plan:   1. Acute upper respiratory infection - suspect viral, likely influenza so ok to start tamiflu and symptomatic care.  Mom (who accompanies pt today and provides most of the hx) had a similar URI sev days ago and was prescribed hycodan cough syrup - ok to give [name] a dose  before bed during acute illness. Due to her med list with alprazolam, I actually think it may be safer for mother to dose rather than give [name] her own prescription.  2. Dehydration   3. Other atopic dermatitis - on hands from overwashing. Try TAC.     Meds ordered this encounter  Medications  . triamcinolone cream (KENALOG) 0.1 %    Sig: Apply 1 application topically 2 (two) times daily. To hands    Dispense:  453.6 g    Refill:  0  . oseltamivir (TAMIFLU) 75 MG capsule    Sig: Take 1 capsule (75 mg total) by mouth 2 (two) times daily.    Dispense:  10 capsule    Refill:  0  . Dextromethorphan-Guaifenesin (MUCINEX DM MAXIMUM STRENGTH) 60-1200 MG TB12    Sig: Take 1 tablet by mouth every 12 (twelve) hours.    Dispense:  14 each    Refill:  1    I personally performed the services described in this documentation, which was scribed in my presence. The recorded information has been reviewed and considered, and addended by me as needed.   Delman Cheadle, M.D.  [facility] [address] [place], [place] [address] [phone] phone [phone] fax  11/13/16 4:25 PM

## 2016-11-16 ENCOUNTER — Ambulatory Visit (INDEPENDENT_AMBULATORY_CARE_PROVIDER_SITE_OTHER): Payer: BLUE CROSS/BLUE SHIELD | Admitting: Psychiatry

## 2016-11-16 ENCOUNTER — Encounter (HOSPITAL_COMMUNITY): Payer: Self-pay | Admitting: Psychiatry

## 2016-11-16 VITALS — BP 112/68 | HR 76 | Ht 65.0 in | Wt 115.4 lb

## 2016-11-16 DIAGNOSIS — Z79899 Other long term (current) drug therapy: Secondary | ICD-10-CM

## 2016-11-16 DIAGNOSIS — F4010 Social phobia, unspecified: Secondary | ICD-10-CM

## 2016-11-16 DIAGNOSIS — Z818 Family history of other mental and behavioral disorders: Secondary | ICD-10-CM

## 2016-11-16 DIAGNOSIS — Z833 Family history of diabetes mellitus: Secondary | ICD-10-CM

## 2016-11-16 DIAGNOSIS — Z825 Family history of asthma and other chronic lower respiratory diseases: Secondary | ICD-10-CM

## 2016-11-16 DIAGNOSIS — Z823 Family history of stroke: Secondary | ICD-10-CM

## 2016-11-16 DIAGNOSIS — F331 Major depressive disorder, recurrent, moderate: Secondary | ICD-10-CM | POA: Diagnosis not present

## 2016-11-16 DIAGNOSIS — F422 Mixed obsessional thoughts and acts: Secondary | ICD-10-CM | POA: Diagnosis not present

## 2016-11-16 DIAGNOSIS — Z880 Allergy status to penicillin: Secondary | ICD-10-CM

## 2016-11-16 DIAGNOSIS — Z800 Family history of malignant neoplasm of digestive organs: Secondary | ICD-10-CM

## 2016-11-16 DIAGNOSIS — Z8489 Family history of other specified conditions: Secondary | ICD-10-CM

## 2016-11-16 DIAGNOSIS — Z801 Family history of malignant neoplasm of trachea, bronchus and lung: Secondary | ICD-10-CM

## 2016-11-16 MED ORDER — BUPROPION HCL ER (XL) 150 MG PO TB24: 150.0000 mg | ORAL_TABLET | Freq: Every day | ORAL | 2 refills | Status: DC

## 2016-11-16 MED ORDER — BUSPIRONE HCL 5 MG PO TABS: 5.0000 mg | ORAL_TABLET | Freq: Three times a day (TID) | ORAL | 2 refills | Status: DC

## 2016-11-16 NOTE — Progress Notes (Signed)
[facility] MD/PA/NP OP Progress Note  11/16/2016 4:01 PM [name]  MRN:  [id number]  Chief Complaint:  Chief Complaint    Anxiety; Depression; Follow-up; Medication Refill     Subjective: "Fine"  HPI: reviewed information below with patient on 11/16/16 and same as previous visits except as noted  States anxiety has been better lately. She is anxious all the time but the intensity is much lower. She is always thinking about the worse case scenario. Pt took Seroquel for a few days and notes that after anxiety decreased significantly. She was worried about SE so she stopped taking it. Pt is not taking Xanax.  Appetite is good and she eats 2 meals/day.   Sleep is good.  Pt showers about once a month. She washes clothes about once a month due to fear of the soap. Pt plans to get a new soap for her next bath.   Pt denies depression. Denies anhedonia, isolation, crying spells, low motivation, poor hygiene, worthlessness and hopelessness. Denies SI/HI/AVH.  Pt rarely goes out.   Reports compliance with Wellbutrin and denies SE.    Here with mother. Pt is walking the dog and is going to weekly therapy. Mom feels the pt is a hypochondriac.    Visit Diagnosis:    ICD-9-CM ICD-10-CM   1. Social anxiety disorder 300.23 F40.10 busPIRone (BUSPAR) 5 MG tablet  2. MDD (major depressive disorder), recurrent episode, moderate ([facility]) 296.32 F33.1 buPROPion (WELLBUTRIN XL) 150 MG 24 hr tablet  3. Mixed obsessional thoughts and acts 300.3 F42.2 busPIRone (BUSPAR) 5 MG tablet     Past Psychiatric History:  Dx: Depression, Anxiety Meds: Lexapro Previous psychiatrist/therapist: Dr. Toy Care for 2016-17, Jola Babinski on a weekly basis Hospitalizations: [facility] 2009 for SIB SIB: last time was in 2009- cutting Suicide attempts: 2 before 2009 Hx of violent behavior towards others: denies Current access to guns: denies Hx of abuse: denies Military Hx: denies Hx of Seizures: denies Hx of TBI:  denies   Previous Psychotropic Medications: Yes   Substance Abuse History in the last 12 months:  No.  Consequences of Substance Abuse: Negative  Past Medical History:  Past Medical History:  Diagnosis Date  . Acute maxillary sinusitis   . Adjustment disorder with mixed anxiety and depressed mood    hx cutting  . Allergic rhinitis   . Anemia   . Chest discomfort   . Dizziness   . Family history of endocrine disorder   . Family history of schizophrenia   . Family history of thyroid disease   . Fatigue   . HTN (hypertension), benign   . Mourning   . Muscle fasciculation   . OCD (obsessive compulsive disorder)   . Palpitations   . POTS (postural orthostatic tachycardia syndrome)   . SOB (shortness of breath)   . Tachycardia   . Vitamin D deficiency     Past Surgical History:  Procedure Laterality Date  . NO PAST SURGERIES      Family Psychiatric and Medical History:  Family History  Problem Relation Age of Onset  . Hyperlipidemia Mother   . Asthma Sister   . Depression Sister   . Colon cancer Maternal Grandmother     Colon  . Diabetes Maternal Grandmother   . Schizophrenia Maternal Grandmother   . Miscarriages / Stillbirths Maternal Grandmother   . Varicose Veins Maternal Grandmother   . Diabetes Maternal Grandfather   . Hearing loss Maternal Grandfather   . Hyperlipidemia Maternal Grandfather   . Stroke  Maternal Grandfather   . Dementia Maternal Grandfather   . Cancer Paternal Grandfather     Esophageal Cancer  . Mental illness Sister     Social History:  Social History   Social History  . Marital status: Single    Spouse name: N/A  . Number of children: 0  . Years of education: 33   Social History Main Topics  . Smoking status: Never Smoker  . Smokeless tobacco: Never Used  . Alcohol use No  . Drug use: No  . Sexual activity: No   Other Topics Concern  . None   Social History Narrative   Currently in college studying, online going  part-time.  Working on 6 credits.   Lives at home with mom and her sister in [place].   Has one cat   Unemployed.    Single, no kids       Allergies:  Allergies  Allergen Reactions  . Penicillins Swelling    Has patient had a PCN reaction causing immediate rash, facial/tongue/throat swelling, SOB or lightheadedness with hypotension: Yes Has patient had a PCN reaction causing severe rash involving mucus membranes or skin necrosis: Yes Has patient had a PCN reaction that required hospitalization No. Has patient had a PCN reaction occurring within the last 10 years: No. If all of the above answers are "NO", then may proceed with Cephalosporin use.     Metabolic Disorder Labs: No results found for: HGBA1C, MPG No results found for: PROLACTIN Lab Results  Component Value Date   CHOL 206 (H) 11/03/2014   TRIG 111.0 11/03/2014   HDL 50.50 11/03/2014   CHOLHDL 4 11/03/2014   VLDL 22.2 11/03/2014   LDLCALC 133 (H) 11/03/2014   LDLCALC 75 09/11/2007     Current Medications: Current Outpatient Prescriptions  Medication Sig Dispense Refill  . ALPRAZolam (XANAX) 0.5 MG tablet Take 1 tablet (0.5 mg total) by mouth at bedtime as needed for anxiety. 30 tablet 2  . buPROPion (WELLBUTRIN XL) 150 MG 24 hr tablet Take 1 tablet (150 mg total) by mouth daily. 30 tablet 2  . calcium-vitamin D (OSCAL WITH D) 500-200 MG-UNIT tablet Take 1 tablet by mouth daily.    . cetirizine (ZYRTEC) 10 MG tablet Take 10 mg by mouth daily.    Marland Kitchen Dextromethorphan-Guaifenesin (MUCINEX DM MAXIMUM STRENGTH) 60-1200 MG TB12 Take 1 tablet by mouth every 12 (twelve) hours. 14 each 1  . Multiple Vitamins-Minerals (MULTIVITAMIN ADULT PO) Take 1 tablet by mouth daily.    . Omega-3 Fatty Acids (OMEGA 3 PO) Take 1 tablet by mouth daily.    Marland Kitchen oseltamivir (TAMIFLU) 75 MG capsule Take 1 capsule (75 mg total) by mouth 2 (two) times daily. 10 capsule 0  . QUEtiapine (SEROQUEL) 25 MG tablet Take 1 tablet (25 mg total) by mouth daily.  30 tablet 2  . triamcinolone cream (KENALOG) 0.1 % Apply 1 application topically 2 (two) times daily. To hands 453.6 g 0   No current facility-administered medications for this visit.       Musculoskeletal: Strength & Muscle Tone: within normal limits Gait & Station: normal Patient leans: N/A  Psychiatric Specialty Exam: reviewed MSE and ROS on 11/16/16 and same as previous visits except as noted  Review of Systems  Constitutional: Positive for fever. Negative for chills, malaise/fatigue and weight loss.  HENT: Positive for congestion and sore throat. Negative for ear pain and hearing loss.   Gastrointestinal: Negative for abdominal pain, heartburn, nausea and vomiting.  Neurological: Negative for  dizziness, tremors, sensory change, seizures, loss of consciousness and headaches.  Psychiatric/Behavioral: Negative for depression, hallucinations, substance abuse and suicidal ideas. The patient is nervous/anxious. The patient does not have insomnia.     Last menstrual period 10/04/2016.There is no height or weight on file to calculate BMI.  General Appearance: Disheveled and Guarded  Eye Contact:  Good  Speech:  Clear and Coherent and Normal Rate  Volume:  Normal  Mood:  Anxious  Affect:  Congruent and Flat  Thought Process:  Coherent  Orientation:  Full (Time, Place, and Person)  Thought Content: Rumination   Suicidal Thoughts:  No  Homicidal Thoughts:  No  Memory:  Immediate;   Good Recent;   Fair Remote;   Fair  Judgement:  Poor  Insight:  Shallow  Psychomotor Activity:  Normal  Concentration:  Concentration: Good  Recall:  Good  Fund of Knowledge: Good  Language: Good  Akathisia:  No  Handed:  Right  AIMS (if indicated):  n/a  Assets:  Desire for Improvement Social Support  ADL's:  Intact  Cognition: WNL  Sleep:  fair    Reviewed A&P  on 11/16/16 and same as previous visits except as noted  Treatment Plan Summary: Medication management and Plan see  below  Assessment:  GAD; Social Anxiety disorder; Panic disorder with agoraphobia; OCD; MDD-recurrent, moderate   Medication management with supportive therapy. Risks/benefits and SE of the medication discussed. Pt verbalized understanding and verbal consent obtained for treatment.  Affirm with the patient that the medications are taken as ordered. Patient expressed understanding of how their medications were to be used.  Meds: Continue Wellbutrin XL 150mg  po qD for mood Continue Xanax 0.5mg  po qD prn anxiety Start trial of Buspar 5mg  po TID for mood and anxiety Pt very fearful of having cardiac SE to any medication. She states she wants to improve her quality of life but has low motivation to do anything to change it right now. Pt is under the care of a cardiologist due to missed heart beats.    Labs: Ekg Nov 18, 2015 QTc 415; EKG 06/30/2016 QTc 398 Glu 121, CBC wNL, TSH WNL    Therapy: brief supportive therapy provided. Discussed psychosocial stressors in detail.   Encouraged pt to develop daily routine and work on daily goal setting as a way to improve mood symptoms.   Consultations:  Encouraged to continue individual therapy  Pt denies SI and is at an acute low risk for suicide. Patient told to call clinic if any problems occur. Patient advised to go to ER if they should develop SI/HI, side effects, or if symptoms worsen. Has crisis numbers to call if needed. Pt verbalized understanding.  F/up in 2 months or sooner if needed     Charlcie Cradle, MD 11/16/2016, 4:01 PM

## 2016-12-21 ENCOUNTER — Ambulatory Visit (INDEPENDENT_AMBULATORY_CARE_PROVIDER_SITE_OTHER): Payer: BLUE CROSS/BLUE SHIELD | Admitting: Family Medicine

## 2016-12-21 ENCOUNTER — Encounter: Payer: Self-pay | Admitting: Family Medicine

## 2016-12-21 VITALS — BP 92/68 | HR 80 | Temp 98.4°F | Ht 65.0 in | Wt 117.3 lb

## 2016-12-21 DIAGNOSIS — H6993 Unspecified Eustachian tube disorder, bilateral: Secondary | ICD-10-CM

## 2016-12-21 DIAGNOSIS — H6983 Other specified disorders of Eustachian tube, bilateral: Secondary | ICD-10-CM

## 2016-12-21 DIAGNOSIS — B349 Viral infection, unspecified: Secondary | ICD-10-CM | POA: Diagnosis not present

## 2016-12-21 NOTE — Progress Notes (Signed)
Pre visit review using our clinic review tool, if applicable. No additional management support is needed unless otherwise documented below in the visit note. 

## 2016-12-21 NOTE — Patient Instructions (Addendum)
Plenty of fluids.  Can use afrin nasal spray for 3 days, then stop. Available over the counter.  Can use flonase 2 sprays each nostril for 3-4 weeks.  Follow up with your doctor if symptoms worsen, you have new concerns or symptoms persist despite treatment.   Eustachian Tube Dysfunction Introduction The eustachian tube connects the middle ear to the back of the nose. It regulates air pressure in the middle ear by allowing air to move between the ear and nose. It also helps to drain fluid from the middle ear space. When the eustachian tube does not function properly, air pressure, fluid, or both can build up in the middle ear. Eustachian tube dysfunction can affect one or both ears. What are the causes? This condition happens when the eustachian tube becomes blocked or cannot open normally. This may result from:  Ear infections.  Colds and other upper respiratory infections.  Allergies.  Irritation, such as from cigarette smoke or acid from the stomach coming up into the esophagus (gastroesophageal reflux).  Sudden changes in air pressure, such as from descending in an airplane. What increases the risk? This condition may be more likely to develop in people who smoke and people who are overweight. Eustachian tube dysfunction may also be more likely to develop in children, especially children who have:  Certain birth defects of the mouth, such as cleft palate.  Large tonsils and adenoids. What are the signs or symptoms? Symptoms of this condition may include:  A feeling of fullness in the ear.  Ear pain.  Clicking or popping noises in the ear.  Ringing in the ear.  Hearing loss.  Loss of balance. Symptoms may get worse when the air pressure around you changes, such as when you travel to an area of high elevation or fly on an airplane. How is this diagnosed? This condition may be diagnosed based on:  Your symptoms.  A physical exam of your ear, nose, and  throat.  Tests, such as those that measure:  The movement of your eardrum (tympanogram).  Your hearing (audiometry). How is this treated? Treatment depends on the cause and severity of your condition. If your symptoms are mild, you may be able to relieve your symptoms by moving air into ("popping") your ears. If you have symptoms of fluid in your ears, treatment may include:  Decongestants.  Antihistamines.  Nasal sprays or ear drops that contain medicines that reduce swelling (steroids). In some cases, you may need to have a procedure to drain the fluid in your eardrum (myringotomy). In this procedure, a small tube is placed in the eardrum to:  Drain the fluid.  Restore the air in the middle ear space. Follow these instructions at home:  Take over-the-counter and prescription medicines only as told by your health care provider.  Use techniques to help pop your ears as recommended by your health care provider. These may include:  Chewing gum.  Yawning.  Frequent, forceful swallowing.  Closing your mouth, holding your nose closed, and gently blowing as if you are trying to blow air out of your nose.  Do not do any of the following until your health care provider approves:  Travel to high altitudes.  Fly in airplanes.  Work in a Pension scheme manager or room.  Scuba dive.  Keep your ears dry. Dry your ears completely after showering or bathing.  Do not smoke.  Keep all follow-up visits as told by your health care provider. This is important. Contact a health care  provider if:  Your symptoms do not go away after treatment.  Your symptoms come back after treatment.  You are unable to pop your ears.  You have:  A fever.  Pain in your ear.  Pain in your head or neck.  Fluid draining from your ear.  Your hearing suddenly changes.  You become very dizzy.  You lose your balance. This information is not intended to replace advice given to you by your health  care provider. Make sure you discuss any questions you have with your health care provider. Document Released: 11/26/2015 Document Revised: 04/06/2016 Document Reviewed: 11/18/2014  2017 [employer]

## 2016-12-21 NOTE — Progress Notes (Signed)
HPI:  Acute visit for:  Ear presure -started: the last 3-4 days -symptoms: has had some pressure and popping in ears, occ mild lightheaded feeling, had diarrhea for a few days - now resolved -denies:fever, SOB, NVD, tooth pain, CP, syncope, presyncope, palpitations -has tried:  -sick contacts/travel/risks: no reported flu, strep or tick exposure -Hx of: allergies ROS: See pertinent positives and negatives per HPI.  Past Medical History:  Diagnosis Date  . Acute maxillary sinusitis   . Adjustment disorder with mixed anxiety and depressed mood    hx cutting  . Allergic rhinitis   . Anemia   . Chest discomfort   . Dizziness   . Family history of endocrine disorder   . Family history of schizophrenia   . Family history of thyroid disease   . Fatigue   . HTN (hypertension), benign   . Mourning   . Muscle fasciculation   . OCD (obsessive compulsive disorder)   . Palpitations   . POTS (postural orthostatic tachycardia syndrome)   . SOB (shortness of breath)   . Tachycardia   . Vitamin D deficiency     Past Surgical History:  Procedure Laterality Date  . NO PAST SURGERIES      Family History  Problem Relation Age of Onset  . Hyperlipidemia Mother   . Asthma Sister   . Depression Sister   . Colon cancer Maternal Grandmother     Colon  . Diabetes Maternal Grandmother   . Schizophrenia Maternal Grandmother   . Miscarriages / Stillbirths Maternal Grandmother   . Varicose Veins Maternal Grandmother   . Diabetes Maternal Grandfather   . Hearing loss Maternal Grandfather   . Hyperlipidemia Maternal Grandfather   . Stroke Maternal Grandfather   . Dementia Maternal Grandfather   . Cancer Paternal Grandfather     Esophageal Cancer  . Mental illness Sister     Social History   Social History  . Marital status: Single    Spouse name: N/A  . Number of children: 0  . Years of education: 75   Social History Main Topics  . Smoking status: Never Smoker  . Smokeless  tobacco: Never Used  . Alcohol use No  . Drug use: No  . Sexual activity: No   Other Topics Concern  . None   Social History Narrative   Currently in college studying, online going part-time.  Working on 6 credits.   Lives at home with mom and her sister in [place].   Has one cat   Unemployed.    Single, no kids        Current Outpatient Prescriptions:  .  ALPRAZolam (XANAX) 0.5 MG tablet, Take 1 tablet (0.5 mg total) by mouth at bedtime as needed for anxiety., Disp: 30 tablet, Rfl: 2 .  buPROPion (WELLBUTRIN XL) 150 MG 24 hr tablet, Take 1 tablet (150 mg total) by mouth daily., Disp: 30 tablet, Rfl: 2 .  busPIRone (BUSPAR) 5 MG tablet, Take 1 tablet (5 mg total) by mouth 3 (three) times daily., Disp: 90 tablet, Rfl: 2 .  cetirizine (ZYRTEC) 10 MG tablet, Take 10 mg by mouth daily., Disp: , Rfl:  .  Multiple Vitamins-Minerals (MULTIVITAMIN ADULT PO), Take 1 tablet by mouth daily., Disp: , Rfl:  .  Omega-3 Fatty Acids (OMEGA 3 PO), Take 1 tablet by mouth daily., Disp: , Rfl:   EXAM:  Vitals:   12/21/16 1623  BP: 92/68  Pulse: 80  Temp: 98.4 F (36.9 C)  Body mass index is 19.52 kg/m.  GENERAL: vitals reviewed and listed above, alert, oriented, appears well hydrated and in no acute distress  HEENT: atraumatic, conjunttiva clear, no obvious abnormalities on inspection of external nose and ears, normal appearance of ear canals and TMs except for clear fluid bilat, lots of clear nasal congestion, mild post oropharyngeal erythema with PND, no tonsillar edema or exudate, no sinus TTP  NECK: no obvious masses on inspection  LUNGS: clear to auscultation bilaterally, no wheezes, rales or rhonchi, good air movement  CV: HRRR, no peripheral edema  ABD: BS+, soft, NTTP  MS: moves all extremities without noticeable abnormality  PSYCH: pleasant and cooperative, no obvious depression or anxiety  ASSESSMENT AND PLAN:  Discussed the following assessment and plan:  Dysfunction  of both eustachian tubes  Viral infection  -given HPI and exam findings today, a serious infection or illness is unlikely. We discussed potential etiologies, with ETD secondary to viral illness being most likely.We discussed treatment side effects, likely course, signs of developing a serious illness and return precautions. -of course, we advised to return or notify a doctor immediately if symptoms worsen or persist or new concerns arise.    Patient Instructions  Plenty of fluids.  Can use afrin nasal spray for 3 days, then stop. Available over the counter.  Can use flonase 2 sprays each nostril for 3-4 weeks.  Follow up with your doctor if symptoms worsen, you have new concerns or symptoms persist despite treatment.   Eustachian Tube Dysfunction Introduction The eustachian tube connects the middle ear to the back of the nose. It regulates air pressure in the middle ear by allowing air to move between the ear and nose. It also helps to drain fluid from the middle ear space. When the eustachian tube does not function properly, air pressure, fluid, or both can build up in the middle ear. Eustachian tube dysfunction can affect one or both ears. What are the causes? This condition happens when the eustachian tube becomes blocked or cannot open normally. This may result from:  Ear infections.  Colds and other upper respiratory infections.  Allergies.  Irritation, such as from cigarette smoke or acid from the stomach coming up into the esophagus (gastroesophageal reflux).  Sudden changes in air pressure, such as from descending in an airplane. What increases the risk? This condition may be more likely to develop in people who smoke and people who are overweight. Eustachian tube dysfunction may also be more likely to develop in children, especially children who have:  Certain birth defects of the mouth, such as cleft palate.  Large tonsils and adenoids. What are the signs or  symptoms? Symptoms of this condition may include:  A feeling of fullness in the ear.  Ear pain.  Clicking or popping noises in the ear.  Ringing in the ear.  Hearing loss.  Loss of balance. Symptoms may get worse when the air pressure around you changes, such as when you travel to an area of high elevation or fly on an airplane. How is this diagnosed? This condition may be diagnosed based on:  Your symptoms.  A physical exam of your ear, nose, and throat.  Tests, such as those that measure:  The movement of your eardrum (tympanogram).  Your hearing (audiometry). How is this treated? Treatment depends on the cause and severity of your condition. If your symptoms are mild, you may be able to relieve your symptoms by moving air into ("popping") your ears. If you have  symptoms of fluid in your ears, treatment may include:  Decongestants.  Antihistamines.  Nasal sprays or ear drops that contain medicines that reduce swelling (steroids). In some cases, you may need to have a procedure to drain the fluid in your eardrum (myringotomy). In this procedure, a small tube is placed in the eardrum to:  Drain the fluid.  Restore the air in the middle ear space. Follow these instructions at home:  Take over-the-counter and prescription medicines only as told by your health care provider.  Use techniques to help pop your ears as recommended by your health care provider. These may include:  Chewing gum.  Yawning.  Frequent, forceful swallowing.  Closing your mouth, holding your nose closed, and gently blowing as if you are trying to blow air out of your nose.  Do not do any of the following until your health care provider approves:  Travel to high altitudes.  Fly in airplanes.  Work in a Pension scheme manager or room.  Scuba dive.  Keep your ears dry. Dry your ears completely after showering or bathing.  Do not smoke.  Keep all follow-up visits as told by your health  care provider. This is important. Contact a health care provider if:  Your symptoms do not go away after treatment.  Your symptoms come back after treatment.  You are unable to pop your ears.  You have:  A fever.  Pain in your ear.  Pain in your head or neck.  Fluid draining from your ear.  Your hearing suddenly changes.  You become very dizzy.  You lose your balance. This information is not intended to replace advice given to you by your health care provider. Make sure you discuss any questions you have with your health care provider. Document Released: 11/26/2015 Document Revised: 04/06/2016 Document Reviewed: 11/18/2014  2017 Elsevier    Colin Benton R., DO

## 2017-01-08 ENCOUNTER — Ambulatory Visit (INDEPENDENT_AMBULATORY_CARE_PROVIDER_SITE_OTHER): Payer: BLUE CROSS/BLUE SHIELD | Admitting: Cardiovascular Disease

## 2017-01-08 ENCOUNTER — Encounter: Payer: Self-pay | Admitting: Cardiovascular Disease

## 2017-01-08 VITALS — BP 108/70 | HR 92 | Ht 65.0 in | Wt 116.8 lb

## 2017-01-08 DIAGNOSIS — F419 Anxiety disorder, unspecified: Secondary | ICD-10-CM

## 2017-01-08 DIAGNOSIS — R002 Palpitations: Secondary | ICD-10-CM | POA: Diagnosis not present

## 2017-01-08 DIAGNOSIS — E7800 Pure hypercholesterolemia, unspecified: Secondary | ICD-10-CM

## 2017-01-08 NOTE — Patient Instructions (Signed)
Medication Instructions:  ?Your physician recommends that you continue on your current medications as directed. Please refer to the Current Medication list given to you today.  ? ?Labwork: ?NONE ? ?Testing/Procedures: ?NONE ? ?Follow-Up: ?AS NEEDED  ? ?  ?

## 2017-01-08 NOTE — Progress Notes (Signed)
Cardiology Office Note   Date:  01/08/2017   ID:  Janet, Logan Feb 03, 1996, MRN [id number]  PCP:  Lottie Dawson, MD  Cardiologist:   Skeet Latch, MD   Chief Complaint  Patient presents with  . Follow-up    3 months;   . Leg Pain    cramping in legs when at the computer.    History of Present Illness: [name] is a 21 y.o. female with anxiety, panic disorder, hypertension and palpitations who presents for follow up.  [name] has severe anxiety and OCD.  She was initially seen due to fear that Seroquel would make her heart stop beating overnight.  She also reported chest pain and shortness of breath in the setting of panic attacks.  She was advised that she was safe to take Seroquel but was still reluctant to start it.  Recently her psychiatrist switched to Buspar, but she has yet to start this medication.  She is now concerned about blood clots, as she sometimes has leg she denies any recent car or plane travel. She is not very physically active and mostly sits at a computer all day.  She did start walking her day every evening and has no symptoms with this activity.  She notes that her palpitations have improved and she no longer has chest pain.  She also denies lower extremity edema, orthopnea or PND.  Past Medical History:  Diagnosis Date  . Acute maxillary sinusitis   . Adjustment disorder with mixed anxiety and depressed mood    hx cutting  . Allergic rhinitis   . Anemia   . Chest discomfort   . Dizziness   . Family history of endocrine disorder   . Family history of schizophrenia   . Family history of thyroid disease   . Fatigue   . HTN (hypertension), benign   . Mourning   . Muscle fasciculation   . OCD (obsessive compulsive disorder)   . Palpitations   . POTS (postural orthostatic tachycardia syndrome)   . SOB (shortness of breath)   . Tachycardia   . Vitamin D deficiency     Past Surgical History:  Procedure Laterality Date  . NO PAST  SURGERIES       Current Outpatient Prescriptions  Medication Sig Dispense Refill  . ALPRAZolam (XANAX) 0.5 MG tablet Take 1 tablet (0.5 mg total) by mouth at bedtime as needed for anxiety. 30 tablet 2  . buPROPion (WELLBUTRIN XL) 150 MG 24 hr tablet Take 1 tablet (150 mg total) by mouth daily. 30 tablet 2  . busPIRone (BUSPAR) 5 MG tablet Take 1 tablet (5 mg total) by mouth 3 (three) times daily. 90 tablet 2  . cetirizine (ZYRTEC) 10 MG tablet Take 10 mg by mouth daily.    . Multiple Vitamins-Minerals (MULTIVITAMIN ADULT PO) Take 1 tablet by mouth daily.    . Omega-3 Fatty Acids (OMEGA 3 PO) Take 1 tablet by mouth daily.     No current facility-administered medications for this visit.     Allergies:   Penicillins    Social History:  The patient  reports that she has never smoked. She has never used smokeless tobacco. She reports that she does not drink alcohol or use drugs.   Family History:  The patient's family history includes Asthma in her sister; Cancer in her paternal grandfather; Colon cancer in her maternal grandmother; Dementia in her maternal grandfather; Depression in her sister; Diabetes in her maternal grandfather and maternal  grandmother; Hearing loss in her maternal grandfather; Hyperlipidemia in her maternal grandfather and mother; Mental illness in her sister; Miscarriages / Stillbirths in her maternal grandmother; Schizophrenia in her maternal grandmother; Stroke in her maternal grandfather; Varicose Veins in her maternal grandmother.    ROS:  Please see the history of present illness.   Otherwise, review of systems are positive for none.   All other systems are reviewed and negative.    PHYSICAL EXAM: VS:  BP 108/70   Pulse 92   Ht 5\' 5"  (1.651 m)   Wt 53 kg (116 lb 12.8 oz)   BMI 19.44 kg/m  , BMI Body mass index is 19.44 kg/m. GENERAL:  Well appearing.  Very anxious.  HEENT:  Pupils equal round and reactive, fundi not visualized, oral mucosa  unremarkable NECK:  N o jugular venous distention, waveform within normal limits, carotid upstroke brisk and symmetric, no bruits LYMPHATICS:  No cervical adenopathy LUNGS:  Clear to auscultation bilaterally HEART:  RRR.  PMI not displaced or sustained,S1 and S2 within normal limits, no S3, no S4, no clicks, no rubs, no murmurs ABD:  Flat, positive bowel sounds normal in frequency in pitch, no bruits, no rebound, no guarding, no midline pulsatile mass, no hepatomegaly, no splenomegaly EXT:  2 plus pulses throughout, no edema, no cyanosis no clubbing SKIN:  No rashes no nodules NEURO:  Cranial nerves II through XII grossly intact, motor grossly intact throughout PSYCH:  Cognitively intact, oriented to person place and time   EKG:  EKG is ordered today. 09/29/16: Sinus tachycardia.  Rate 102 bpm.  Non-specific ST-T changes.   Recent Labs: No results found for requested labs within last 8760 hours.    Lipid Panel    Component Value Date/Time   CHOL 206 (H) 11/03/2014 1505   TRIG 111.0 11/03/2014 1505   HDL 50.50 11/03/2014 1505   CHOLHDL 4 11/03/2014 1505   VLDL 22.2 11/03/2014 1505   LDLCALC 133 (H) 11/03/2014 1505      Wt Readings from Last 3 Encounters:  01/08/17 53 kg (116 lb 12.8 oz)  12/21/16 53.2 kg (117 lb 4.8 oz)  11/16/16 52.3 kg (115 lb 6.4 oz)      ASSESSMENT AND PLAN:  # Palpitations:  Resolved.    # Anxiety:  Remains poorly-controlled.  She was encouraged to take BuSpar as recommended by her psychiatrist.  She was reassured that it is unlikely to cause any cardiac symptoms. She was also reassured that it is extremely unlikely that she has clots. If she has cramping I recommended that she try eating mustard, drinking tonic water, or drinking Gatorade. She is not interested in having her electrolytes checked today.  # Hyperlipidemia: Recommended increased physical activity.    Current medicines are reviewed at length with the patient today.  The patient does  not have concerns regarding medicines.  The following changes have been made:  no change  Labs/ tests ordered today include:   No orders of the defined types were placed in this encounter.  Time spent: 30 minutes-Greater than 50% of this time was spent in counseling, explanation of diagnosis, planning of further management, and coordination of care.   Disposition:   FU with Janyia Guion C. Oval Linsey, MD, [facility] as needed.   This note was written with the assistance of speech recognition software.  Please excuse any transcriptional errors.  Signed, Kiauna Zywicki C. Oval Linsey, MD, [facility]  01/08/2017 5:53 PM    [place]

## 2017-01-18 ENCOUNTER — Encounter (HOSPITAL_COMMUNITY): Payer: Self-pay | Admitting: Psychiatry

## 2017-01-18 ENCOUNTER — Ambulatory Visit (INDEPENDENT_AMBULATORY_CARE_PROVIDER_SITE_OTHER): Payer: PRIVATE HEALTH INSURANCE | Admitting: Psychiatry

## 2017-01-18 VITALS — BP 98/78 | HR 101 | Ht 65.0 in | Wt 116.4 lb

## 2017-01-18 DIAGNOSIS — F4001 Agoraphobia with panic disorder: Secondary | ICD-10-CM | POA: Diagnosis not present

## 2017-01-18 DIAGNOSIS — Z880 Allergy status to penicillin: Secondary | ICD-10-CM | POA: Diagnosis not present

## 2017-01-18 DIAGNOSIS — Z818 Family history of other mental and behavioral disorders: Secondary | ICD-10-CM | POA: Diagnosis not present

## 2017-01-18 DIAGNOSIS — Z810 Family history of intellectual disabilities: Secondary | ICD-10-CM

## 2017-01-18 DIAGNOSIS — F4010 Social phobia, unspecified: Secondary | ICD-10-CM | POA: Diagnosis not present

## 2017-01-18 DIAGNOSIS — F331 Major depressive disorder, recurrent, moderate: Secondary | ICD-10-CM

## 2017-01-18 DIAGNOSIS — F429 Obsessive-compulsive disorder, unspecified: Secondary | ICD-10-CM

## 2017-01-18 DIAGNOSIS — F411 Generalized anxiety disorder: Secondary | ICD-10-CM | POA: Diagnosis not present

## 2017-01-18 DIAGNOSIS — Z79899 Other long term (current) drug therapy: Secondary | ICD-10-CM

## 2017-01-18 MED ORDER — BUPROPION HCL ER (XL) 150 MG PO TB24: 150.0000 mg | ORAL_TABLET | Freq: Every day | ORAL | 2 refills | Status: DC

## 2017-01-18 NOTE — Progress Notes (Signed)
[name] MD/PA/NP OP Progress Note  01/18/2017 4:35 PM [name]  MRN:  [id number]  Chief Complaint:  Chief Complaint    Follow-up     Subjective: "I started Buspar 3 days ago"  HPI: reviewed information below with patient on 01/18/17 and same as previous visits except as noted  Pt states she got really tired of being stuck in her room. She felt trapped in there so she decided to try the Buspar. Denies SE as of yet. Pt talked to a friend who is moving on with her life so pt decided she also needed to make some changes as well.  Pt denies panic attacks. Pt has not been taking Xanax.  Sleep is good and she is getting about 9 hrs/night. Appetite is good and is still eating 2 meals/day  Pt denies depression. Denies anhedonia, isolation, crying spells, low motivation, poor hygiene, worthlessness and hopelessness. Denies SI/HI.  Pt rarely goes out except for doctor appts.   Pt is taking showers twice a month and is working on taking it more often.     Visit Diagnosis:    ICD-9-CM ICD-10-CM   1. Social anxiety disorder 300.23 F40.10   2. MDD (major depressive disorder), recurrent episode, moderate (HCC) 296.32 F33.1 buPROPion (WELLBUTRIN XL) 150 MG 24 hr tablet  3. GAD (generalized anxiety disorder) 300.02 F41.1      Past Psychiatric History:  Dx: Depression, Anxiety Meds: Lexapro Previous psychiatrist/therapist: Dr. Toy Care for 2016-17, Jola Babinski on a weekly basis Hospitalizations: [facility] 2009 for SIB SIB: last time was in 2009- cutting Suicide attempts: 2 before 2009 Hx of violent behavior towards others: denies Current access to guns: denies Hx of abuse: denies Military Hx: denies Hx of Seizures: denies Hx of TBI: denies   Previous Psychotropic Medications: Yes   Substance Abuse History in the last 12 months:  No.  Consequences of Substance Abuse: Negative  Past Medical History:  Past Medical History:  Diagnosis Date  . Acute maxillary sinusitis   . Adjustment  disorder with mixed anxiety and depressed mood    hx cutting  . Allergic rhinitis   . Anemia   . Chest discomfort   . Dizziness   . Family history of endocrine disorder   . Family history of schizophrenia   . Family history of thyroid disease   . Fatigue   . HTN (hypertension), benign   . Mourning   . Muscle fasciculation   . OCD (obsessive compulsive disorder)   . Palpitations   . POTS (postural orthostatic tachycardia syndrome)   . SOB (shortness of breath)   . Tachycardia   . Vitamin D deficiency     Past Surgical History:  Procedure Laterality Date  . NO PAST SURGERIES      Family Psychiatric and Medical History:  Family History  Problem Relation Age of Onset  . Hyperlipidemia Mother   . Asthma Sister   . Depression Sister   . Colon cancer Maternal Grandmother     Colon  . Diabetes Maternal Grandmother   . Schizophrenia Maternal Grandmother   . Miscarriages / Stillbirths Maternal Grandmother   . Varicose Veins Maternal Grandmother   . Diabetes Maternal Grandfather   . Hearing loss Maternal Grandfather   . Hyperlipidemia Maternal Grandfather   . Stroke Maternal Grandfather   . Dementia Maternal Grandfather   . Cancer Paternal Grandfather     Esophageal Cancer  . Mental illness Sister     Social History:  Social History   Social  History  . Marital status: Single    Spouse name: N/A  . Number of children: 0  . Years of education: 73   Social History Main Topics  . Smoking status: Never Smoker  . Smokeless tobacco: Never Used  . Alcohol use No  . Drug use: No  . Sexual activity: No   Other Topics Concern  . None   Social History Narrative   Currently in college studying, online going part-time.  Working on 6 credits.   Lives at home with mom and her sister in [place].   Has one cat   Unemployed.    Single, no kids       Allergies:  Allergies  Allergen Reactions  . Penicillins Swelling    Has patient had a PCN reaction causing immediate rash,  facial/tongue/throat swelling, SOB or lightheadedness with hypotension: Yes Has patient had a PCN reaction causing severe rash involving mucus membranes or skin necrosis: Yes Has patient had a PCN reaction that required hospitalization No. Has patient had a PCN reaction occurring within the last 10 years: No. If all of the above answers are "NO", then may proceed with Cephalosporin use.     Metabolic Disorder Labs: No results found for: HGBA1C, MPG No results found for: PROLACTIN Lab Results  Component Value Date   CHOL 206 (H) 11/03/2014   TRIG 111.0 11/03/2014   HDL 50.50 11/03/2014   CHOLHDL 4 11/03/2014   VLDL 22.2 11/03/2014   LDLCALC 133 (H) 11/03/2014   LDLCALC 75 09/11/2007     Current Medications: Current Outpatient Prescriptions  Medication Sig Dispense Refill  . buPROPion (WELLBUTRIN XL) 150 MG 24 hr tablet Take 1 tablet (150 mg total) by mouth daily. 30 tablet 2  . busPIRone (BUSPAR) 5 MG tablet Take 1 tablet (5 mg total) by mouth 3 (three) times daily. (Patient taking differently: Take 5 mg by mouth 2 (two) times daily. ) 90 tablet 2  . cetirizine (ZYRTEC) 10 MG tablet Take 10 mg by mouth daily.    . Multiple Vitamins-Minerals (MULTIVITAMIN ADULT PO) Take 1 tablet by mouth daily.    . Omega-3 Fatty Acids (OMEGA 3 PO) Take 1 tablet by mouth daily.    Marland Kitchen ALPRAZolam (XANAX) 0.5 MG tablet Take 1 tablet (0.5 mg total) by mouth at bedtime as needed for anxiety. (Patient not taking: Reported on 01/18/2017) 30 tablet 2   No current facility-administered medications for this visit.       Musculoskeletal: Strength & Muscle Tone: within normal limits Gait & Station: normal Patient leans: N/A  Psychiatric Specialty Exam: reviewed MSE and ROS on 01/18/17 and same as previous visits except as noted  Review of Systems  HENT: Negative for congestion, ear pain, hearing loss, sinus pain and sore throat.   Neurological: Positive for headaches. Negative for dizziness, tremors,  sensory change, seizures and loss of consciousness.  Psychiatric/Behavioral: Negative for depression, hallucinations, substance abuse and suicidal ideas. The patient is nervous/anxious. The patient does not have insomnia.     Height 5\' 5"  (1.651 m), weight 116 lb 6.4 oz (52.8 kg).Body mass index is 19.37 kg/m.  General Appearance: Disheveled and Guarded  Eye Contact:  Good  Speech:  Clear and Coherent and Normal Rate  Volume:  Normal  Mood:  Anxious  Affect:  Congruent and Flat  Thought Process:  Coherent  Orientation:  Full (Time, Place, and Person)  Thought Content: Rumination   Suicidal Thoughts:  No  Homicidal Thoughts:  No  Memory:  Immediate;  Good Recent;   Fair Remote;   Fair  Judgement:  Poor  Insight:  Shallow  Psychomotor Activity:  Normal  Concentration:  Concentration: Good  Recall:  Good  Fund of Knowledge: Good  Language: Good  Akathisia:  No  Handed:  Right  AIMS (if indicated):  n/a  Assets:  Desire for Improvement Social Support  ADL's:  Intact  Cognition: WNL  Sleep:  fair    Reviewed A&P  on 01/18/17 and same as previous visits except as noted  Treatment Plan Summary: Medication management and Plan see below  Assessment:  GAD; Social Anxiety disorder; Panic disorder with agoraphobia; OCD; MDD-recurrent, moderate   Medication management with supportive therapy. Risks/benefits and SE of the medication discussed. Pt verbalized understanding and verbal consent obtained for treatment.  Affirm with the patient that the medications are taken as ordered. Patient expressed understanding of how their medications were to be used.  Meds: Continue Wellbutrin XL 150mg  po qD for mood Continue Xanax 0.5mg  po qD prn anxiety Buspar 5mg  po TID for mood and anxiety Pt very fearful of having cardiac SE to any medication. She states she wants to improve her quality of life but has low motivation to do anything to change it right now. Pt is under the care of a  cardiologist due to missed heart beats.    Labs: Ekg Nov 18, 2015 QTc 415; EKG 06/30/2016 QTc 398 Glu 121, CBC wNL, TSH WNL    Therapy: brief supportive therapy provided. Discussed psychosocial stressors in detail.   Encouraged pt to develop daily routine and work on daily goal setting as a way to improve mood symptoms.   Consultations:  Encouraged to continue individual therapy  Pt denies SI and is at an acute low risk for suicide. Patient told to call clinic if any problems occur. Patient advised to go to ER if they should develop SI/HI, side effects, or if symptoms worsen. Has crisis numbers to call if needed. Pt verbalized understanding.  F/up in 2 months or sooner if needed     Charlcie Cradle, MD 01/18/2017, 4:35 PM

## 2017-02-01 ENCOUNTER — Telehealth (HOSPITAL_COMMUNITY): Payer: Self-pay

## 2017-02-01 NOTE — Telephone Encounter (Signed)
Patients mother is calling, she said that patient has been getting sick on the Buspar. She is taking it twice a day, patients mother wants to know if adding the third dose could help her feel better or should she stop all together. Patients mother was also asking about changing doctors because she is concerned that patient may need to be seen more often until they can get her anxiety under control.

## 2017-02-01 NOTE — Telephone Encounter (Signed)
Sick how?   If the patient is wanting to switch to one of the full time doctors that is fine

## 2017-02-01 NOTE — Telephone Encounter (Signed)
Upset stomach and nausea.Marland KitchenMarland Kitchen

## 2017-02-05 NOTE — Telephone Encounter (Signed)
That's fine she is welcome to transfer to my care.

## 2017-02-05 NOTE — Telephone Encounter (Signed)
This patients mother feels like she needs a doctor that is in the office and more accessible. Dr. Doyne Keel is okay with her switching her care (see note below) is it okay to put her on your schedule? Please review and advise, thank you.

## 2017-02-09 ENCOUNTER — Ambulatory Visit (HOSPITAL_COMMUNITY): Payer: Self-pay | Admitting: Psychiatry

## 2017-02-14 ENCOUNTER — Ambulatory Visit (HOSPITAL_COMMUNITY): Payer: Self-pay | Admitting: Psychiatry

## 2017-02-15 IMAGING — CR DG CHEST 2V
2 series · 2 of 2 positions shown · non-contrast
Comparison: Chest radiograph performed 10/24/2007

CLINICAL DATA: Acute onset of tachycardia and generalized chest
pain. Initial encounter.

EXAM:
CHEST  2 VIEW

[w chest pa]
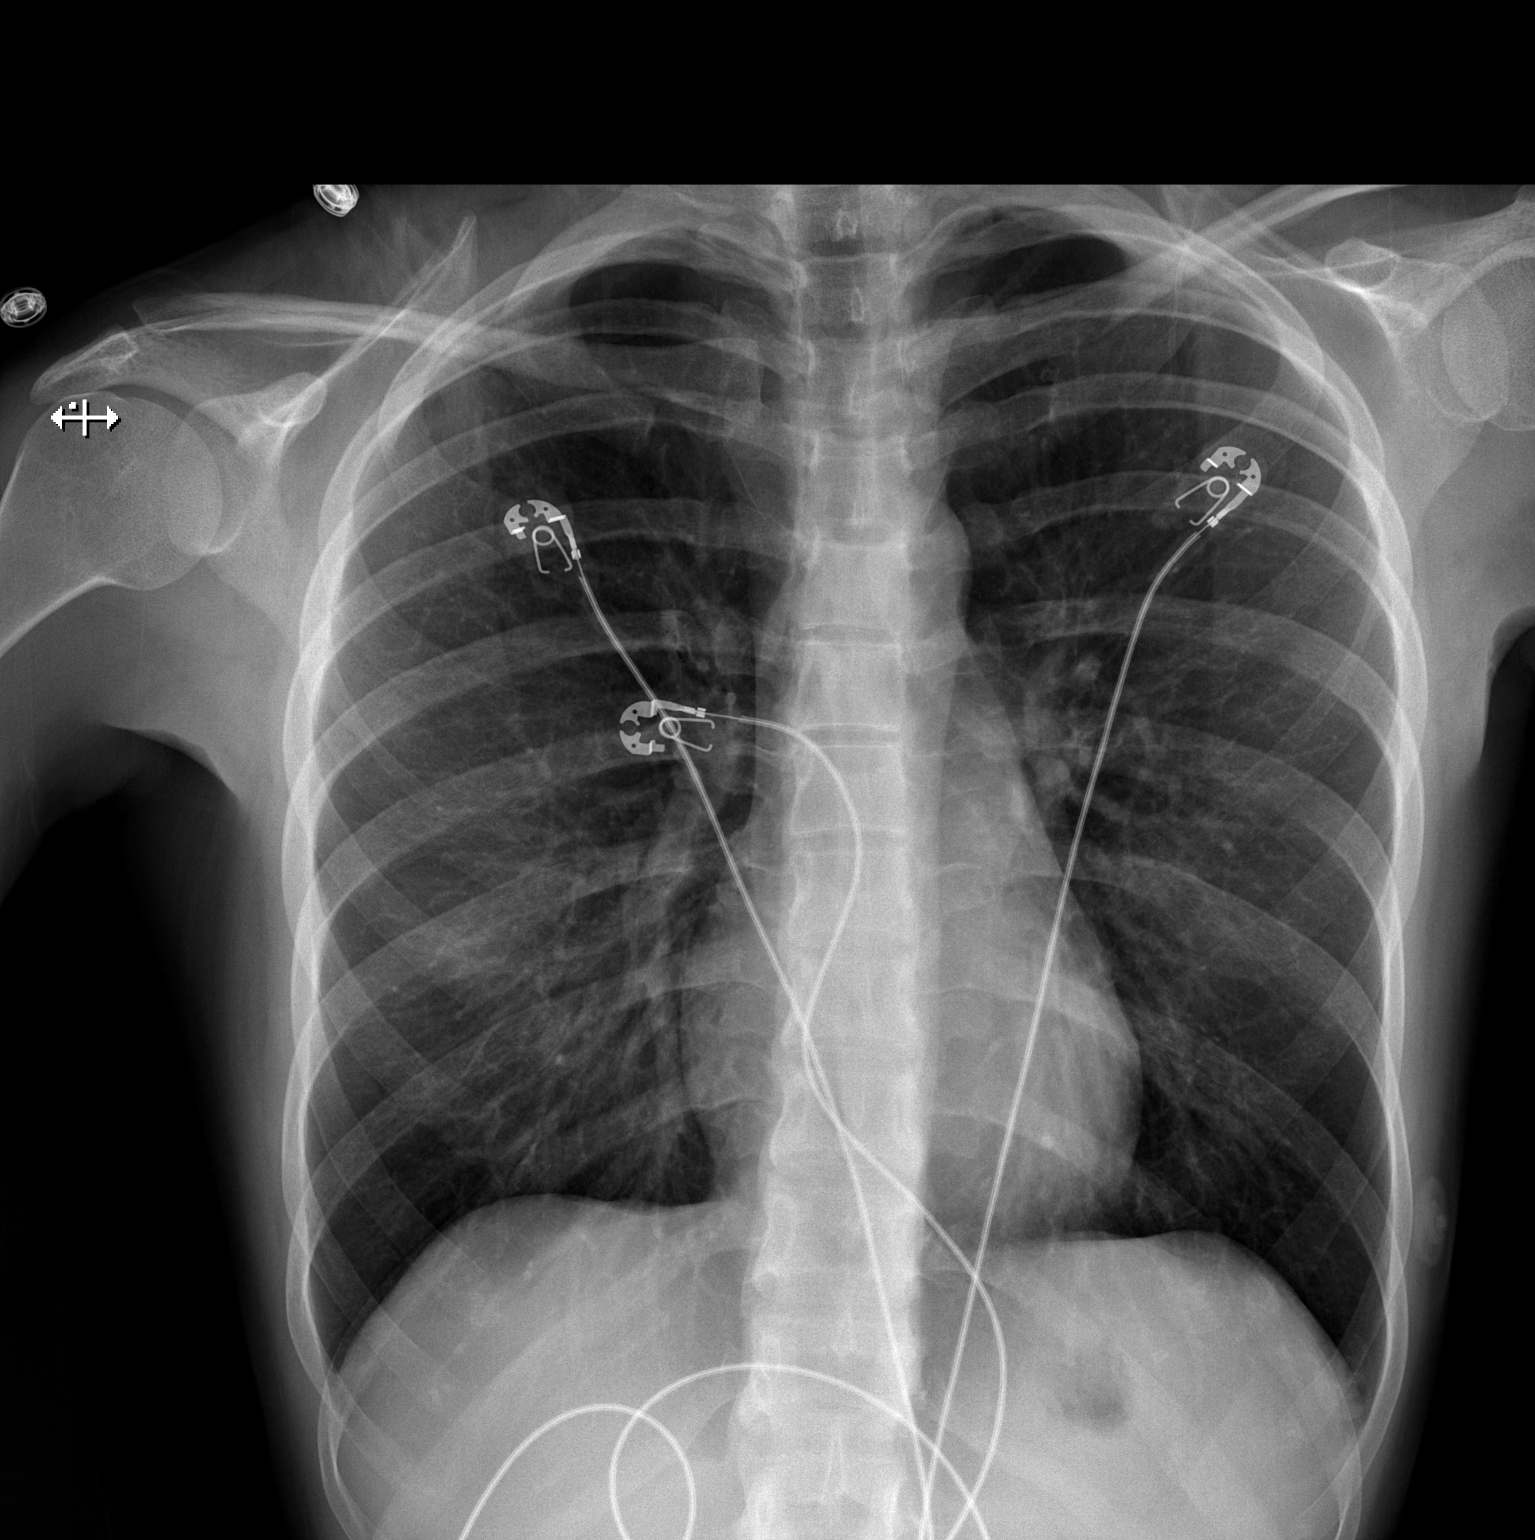

[w chest lat]
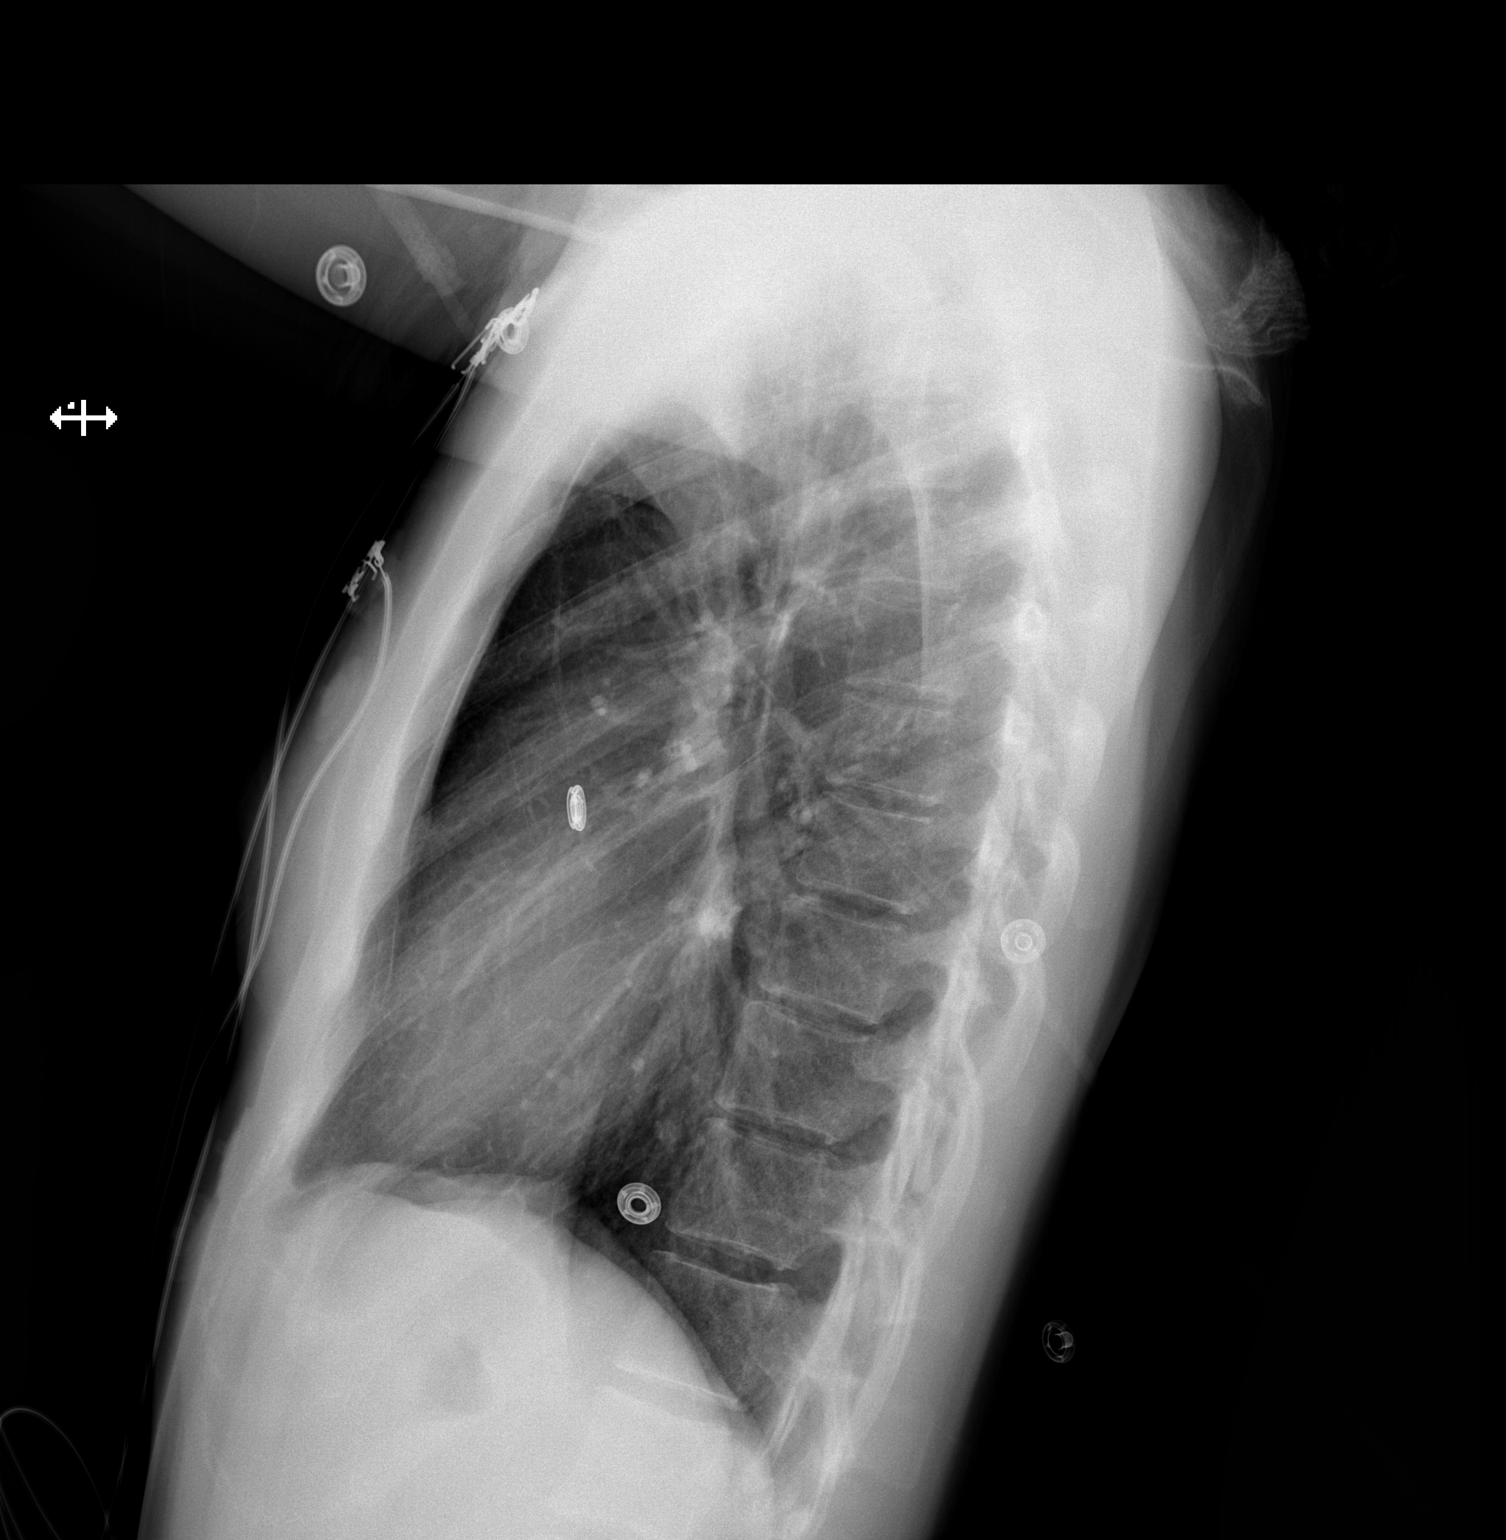

[2 of 2 positions shown; findings below may reference images not displayed]

FINDINGS: The lungs are well-aerated and clear. There is no evidence of focal
opacification, pleural effusion or pneumothorax.

The heart is normal in size; the mediastinal contour is within
normal limits. No acute osseous abnormalities are seen.
IMPRESSION: No acute cardiopulmonary process seen.

## 2017-03-15 ENCOUNTER — Ambulatory Visit (INDEPENDENT_AMBULATORY_CARE_PROVIDER_SITE_OTHER): Payer: BLUE CROSS/BLUE SHIELD | Admitting: Psychiatry

## 2017-03-15 VITALS — BP 122/68 | HR 90 | Ht 65.0 in | Wt 117.2 lb

## 2017-03-15 DIAGNOSIS — Z810 Family history of intellectual disabilities: Secondary | ICD-10-CM

## 2017-03-15 DIAGNOSIS — Z818 Family history of other mental and behavioral disorders: Secondary | ICD-10-CM

## 2017-03-15 DIAGNOSIS — F422 Mixed obsessional thoughts and acts: Secondary | ICD-10-CM | POA: Diagnosis not present

## 2017-03-15 DIAGNOSIS — Z79899 Other long term (current) drug therapy: Secondary | ICD-10-CM

## 2017-03-15 MED ORDER — FLUOXETINE HCL 20 MG PO CAPS: 20.0000 mg | ORAL_CAPSULE | Freq: Every day | ORAL | 1 refills | Status: DC

## 2017-03-15 NOTE — Patient Instructions (Signed)
STOP Wellbutrin  STOP Buspar  STOP Xanax  Start Prozac 20 mg daily, come back in 4 weeks

## 2017-03-15 NOTE — Progress Notes (Signed)
[place] MD/PA/NP OP Progress Note  03/15/2017 4:08 PM [name]  MRN:  [id number]  Chief Complaint:  med management, anxiety, sleep  Subjective:  [name] presents today for a transfer care, due to provider availability.  I spent time learning the patient's social and psychiatric history, and spending time building rapport.  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 She does rare socialization, and continues to take showers intermittently, approximately 1 per week.  Regarding her current psychiatric regimen, she has discontinued BuSpar, as she feels like this worsened her OCD symptoms, feeling like she was having more thoughts to straighten crooked objects, and check things. She is intermittently adherent to Wellbutrin, and she does not use Xanax at all for fear of addiction.  She does not have any auditory or visual hallucinations. She continues to be very anxious about social interactions with others, as she is self-conscious and with poor self-esteem.  She also reports that she doesn't have many interest in dating, and is not particularly interested in men or women.  I spent a long time with the mom and the patient providing psychoeducation, reassurance, and empathizing with the patient's concern regarding medications. I taught her about the physiology of a medication called St. John's wort, and drew a parallel to the mechanism of action of fluoxetine.  She was more comfortable to try fluoxetine, especially given that her mother and her grandmother have both been on that medication with excellent results. She denies any suicidality or safety issues. She does not use drugs or drink alcohol..  Visit Diagnosis:    ICD-9-CM ICD-10-CM   1. 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 Acute maxillary sinusitis   . Adjustment disorder with mixed anxiety and depressed mood    hx cutting  . Allergic rhinitis   . Anemia   . Chest discomfort   . Dizziness   . Family history of endocrine disorder   . Family history of schizophrenia   . Family history of thyroid disease   . Fatigue   . HTN (hypertension), benign   . Mourning   . Muscle fasciculation   . OCD (obsessive compulsive disorder)   . Palpitations   . POTS (postural orthostatic tachycardia syndrome)   . SOB (shortness of breath)   . Tachycardia   . Vitamin D deficiency     Past Surgical History:  Procedure Laterality Date  . NO PAST SURGERIES      Family Psychiatric History: reviewed as below  Family History:  Family History  Problem Relation Age of Onset  . Hyperlipidemia Mother   . Asthma Sister   . Depression Sister   . Colon cancer Maternal Grandmother     Colon  . Diabetes Maternal Grandmother   . Schizophrenia Maternal Grandmother   . Miscarriages / Stillbirths Maternal Grandmother   . Varicose Veins Maternal Grandmother   . Diabetes Maternal Grandfather   . Hearing loss Maternal Grandfather   . Hyperlipidemia Maternal Grandfather   . Stroke Maternal Grandfather   . Dementia Maternal Grandfather   . Cancer Paternal Grandfather     Esophageal Cancer  . Mental illness Sister  Social History:  Social History   Social History  . Marital status: Single    Spouse name: N/A  . Number of children: 0  . Years of education: 36   Social History Main Topics  . Smoking status: Never Smoker  . Smokeless tobacco: Never Used  . Alcohol use No  . Drug use: No  . Sexual activity: No   Other Topics Concern  . Not on file   Social History Narrative   Currently in college studying, online going part-time.  Working on  6 credits.   Lives at home with mom and her sister in [place].   Has one cat   Unemployed.    Single, no kids       Allergies:  Allergies  Allergen Reactions  . Penicillins Swelling    Has patient had a PCN reaction causing immediate rash, facial/tongue/throat swelling, SOB or lightheadedness with hypotension: Yes Has patient had a PCN reaction causing severe rash involving mucus membranes or skin necrosis: Yes Has patient had a PCN reaction that required hospitalization No. Has patient had a PCN reaction occurring within the last 10 years: No. If all of the above answers are "NO", then may proceed with Cephalosporin use.     Metabolic Disorder Labs: No results found for: HGBA1C, MPG No results found for: PROLACTIN Lab Results  Component Value Date   CHOL 206 (H) 11/03/2014   TRIG 111.0 11/03/2014   HDL 50.50 11/03/2014   CHOLHDL 4 11/03/2014   VLDL 22.2 11/03/2014   LDLCALC 133 (H) 11/03/2014   LDLCALC 75 09/11/2007     Current Medications: Current Outpatient Prescriptions  Medication Sig Dispense Refill  . cetirizine (ZYRTEC) 10 MG tablet Take 10 mg by mouth daily.    Marland Kitchen FLUoxetine (PROZAC) 20 MG capsule Take 1 capsule (20 mg total) by mouth daily. 90 capsule 1  . Multiple Vitamins-Minerals (MULTIVITAMIN ADULT PO) Take 1 tablet by mouth daily.    . Omega-3 Fatty Acids (OMEGA 3 PO) Take 1 tablet by mouth daily.     No current facility-administered medications for this visit.     Neurologic: Headache: Negative Seizure: Negative Paresthesias: Negative  Musculoskeletal: Strength & Muscle Tone: within normal limits Gait & Station: normal Patient leans: N/A  Psychiatric Specialty Exam: ROS  Blood pressure 122/68, pulse 90, height 5\' 5"  (1.651 m), weight 117 lb 3.2 oz (53.2 kg).Body mass index is 19.5 kg/m.  General Appearance: Casual and Fairly Groomed  Eye Contact:  Fair  Speech:  Normal Rate  Volume:  Decreased  Mood:  Dysphoric  Affect:  Appropriate and  Congruent  Thought Process:  Goal Directed  Orientation:  Full (Time, Place, and Person)  Thought Content: Logical   Suicidal Thoughts:  No  Homicidal Thoughts:  No  Memory:  Immediate;   Poor  Judgement:  Fair  Insight:  Fair  Psychomotor Activity:  Normal  Concentration:  Concentration: Fair  Recall:  Good  Fund of Knowledge: Good  Language: Good  Akathisia:  Negative  Handed:  Right  AIMS (if indicated):  0  Assets:  Communication Skills Desire for Improvement Financial Resources/Insurance Housing Physical Health Vocational/Educational  ADL's:  Intact  Cognition: WNL  Sleep:  6-9 hours, sleep circadian rhythm delay     Treatment Plan Summary: [name] is a 21 year old female with a psychiatric history consistent with generalized anxiety disorder, social anxiety, and agoraphobia.  She presents today for psychiatric transfer of care, due to wanting to see a provider with  more in clinic availability.  She has a significant network of friends and her 79 of [employer], and is reportedly ranked fairly high on the network. She is less interested in interpersonal socialization, but understands that she needs this skill, and needs to master this anxiety for regular participation in the world. 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  She does not have any acute safety issues. She is notably intermittently adherent to her regimen of Wellbutrin, BuSpar, and Xanax.  I spent time with the patient learning about her past history, and some of her social interests. We agreed to discontinue all 3 medications that she is currently on, and initiate monotherapy with Prozac 20 mg daily. She will follow-up with writer in one month, and she continues to engage in therapy on a weekly basis in the community.  1. Mixed obsessional thoughts and acts    Discontinue Wellbutrin, BuSpar, and Xanax; patient was using Wellbutrin more regularly, but has not used BuSpar or  Xanax in months Continue in therapy with her psychologist for CBT and exposure Initiate Prozac 20 mg daily; patient has a family history of positive response to Prozac Follow-up in 1 month  Aundra Dubin, MD 03/15/2017, 4:08 PM

## 2017-03-22 ENCOUNTER — Ambulatory Visit (HOSPITAL_COMMUNITY): Payer: Self-pay | Admitting: Psychiatry

## 2017-04-18 ENCOUNTER — Encounter (HOSPITAL_COMMUNITY): Payer: Self-pay | Admitting: Psychiatry

## 2017-04-18 ENCOUNTER — Ambulatory Visit (INDEPENDENT_AMBULATORY_CARE_PROVIDER_SITE_OTHER): Payer: BLUE CROSS/BLUE SHIELD | Admitting: Psychiatry

## 2017-04-18 DIAGNOSIS — F422 Mixed obsessional thoughts and acts: Secondary | ICD-10-CM | POA: Diagnosis not present

## 2017-04-18 DIAGNOSIS — Z810 Family history of intellectual disabilities: Secondary | ICD-10-CM

## 2017-04-18 DIAGNOSIS — Z818 Family history of other mental and behavioral disorders: Secondary | ICD-10-CM | POA: Diagnosis not present

## 2017-04-18 MED ORDER — FLUOXETINE HCL 20 MG PO CAPS: 40.0000 mg | ORAL_CAPSULE | Freq: Every day | ORAL | 1 refills | Status: DC

## 2017-04-18 NOTE — Patient Instructions (Addendum)
Lets imagine that your mood is suddenly all better.  What will you do with this new sense of mental health?  IE - go out more, schooling, dating, hanging out with friends, etc????   Please make a list to share with me, if you want  Keep on the Prozac 20 mg daily for another 2-4 weeks.  If the side effects resolve, go ahead and increase to 40 mg daily if you feel like you need to.  Come back and see me in about 6 weeks or so  Ask [name] to help you with your homework (no cheating) and also for feedback on how she thinks you are doing, progressing, or areas to continue working on

## 2017-04-18 NOTE — Progress Notes (Signed)
[place] MD/PA/NP OP Progress Note  04/18/2017 4:32 PM [name]  MRN:  [id number]  Chief Complaint:  Chief Complaint    Follow-up     Subjective:  [name] presents today for psychiatric follow-up. She has tolerated Prozac without any significant side effects except for some slight loose stools. She notes that she has had more vivid dreams. We agreed to continue on the current dose, as she does feel like she has had some benefits, particularly with some improvements and self-esteem less self-conscious in social groups, and she notes that she is not as ruminative as she is generally. Spent time discussing the risks and benefits of increasing to 40 mg, and I encouraged the patient to monitor over the next couple weeks to see if her side effects resolve, and if she feels the need, she is okay to increase to 40 mg daily.  She Continues in therapy with [name] once weekly.  Spent time discussing some self-monitoring strategies including mindfulness behaviors with head space on her iPhone, and aching a list of what she would like to do with her life as her mood and anxiety improves. I also asked her to get feedback from her therapist [name], and to get her input on how she feels like [name] is progressing or if she feels like there are areas that are still in dire need of improvement.  Visit Diagnosis:    ICD-10-CM   1. Mixed obsessional thoughts and acts F42.2 FLUoxetine (PROZAC) 20 MG capsule    Past Psychiatric History: See intake H&P for full details. Reviewed, with no updates at this time.   Past Medical History:  Past Medical History:  Diagnosis Date  . Acute maxillary sinusitis   . Adjustment disorder with mixed anxiety and depressed mood    hx cutting  . Allergic rhinitis   . Anemia   . Chest discomfort   . Dizziness   . Family history of endocrine disorder   . Family history of schizophrenia   . Family history of thyroid disease   . Fatigue   . HTN (hypertension), benign    . Mourning   . Muscle fasciculation   . OCD (obsessive compulsive disorder)   . Palpitations   . POTS (postural orthostatic tachycardia syndrome)   . SOB (shortness of breath)   . Tachycardia   . Vitamin D deficiency     Past Surgical History:  Procedure Laterality Date  . NO PAST SURGERIES      Family Psychiatric History: See intake H&P for full details. Reviewed, with no updates at this time.   Family History:  Family History  Problem Relation Age of Onset  . Hyperlipidemia Mother   . Asthma Sister   . Depression Sister   . Colon cancer Maternal Grandmother        Colon  . Diabetes Maternal Grandmother   . Schizophrenia Maternal Grandmother   . Miscarriages / Stillbirths Maternal Grandmother   . Varicose Veins Maternal Grandmother   . Diabetes Maternal Grandfather   . Hearing loss Maternal Grandfather   . Hyperlipidemia Maternal Grandfather   . Stroke Maternal Grandfather   . Dementia Maternal Grandfather   . Cancer Paternal Grandfather        Esophageal Cancer  . Mental illness Sister     Social History:  Social History   Social History  . Marital status: Single    Spouse name: N/A  . Number of children: 0  . Years of education: 28  Social History Main Topics  . Smoking status: Never Smoker  . Smokeless tobacco: Never Used  . Alcohol use No  . Drug use: No  . Sexual activity: No   Other Topics Concern  . None   Social History Narrative   Currently in college studying, online going part-time.  Working on 6 credits.   Lives at home with mom and her sister in [place].   Has one cat   Unemployed.    Single, no kids       Allergies:  Allergies  Allergen Reactions  . Penicillins Swelling    Has patient had a PCN reaction causing immediate rash, facial/tongue/throat swelling, SOB or lightheadedness with hypotension: Yes Has patient had a PCN reaction causing severe rash involving mucus membranes or skin necrosis: Yes Has patient had a PCN reaction  that required hospitalization No. Has patient had a PCN reaction occurring within the last 10 years: No. If all of the above answers are "NO", then may proceed with Cephalosporin use.     Metabolic Disorder Labs: No results found for: HGBA1C, MPG No results found for: PROLACTIN Lab Results  Component Value Date   CHOL 206 (H) 11/03/2014   TRIG 111.0 11/03/2014   HDL 50.50 11/03/2014   CHOLHDL 4 11/03/2014   VLDL 22.2 11/03/2014   LDLCALC 133 (H) 11/03/2014   LDLCALC 75 09/11/2007     Current Medications: Current Outpatient Prescriptions  Medication Sig Dispense Refill  . cetirizine (ZYRTEC) 10 MG tablet Take 10 mg by mouth daily.    Marland Kitchen FLUoxetine (PROZAC) 20 MG capsule Take 2 capsules (40 mg total) by mouth daily. 90 capsule 1  . Multiple Vitamins-Minerals (MULTIVITAMIN ADULT PO) Take 1 tablet by mouth daily.    . Omega-3 Fatty Acids (OMEGA 3 PO) Take 1 tablet by mouth daily.     No current facility-administered medications for this visit.     Neurologic: Headache: Negative Seizure: Negative Paresthesias: Negative  Musculoskeletal: Strength & Muscle Tone: within normal limits Gait & Station: normal Patient leans: N/A  Psychiatric Specialty Exam: ROS  Blood pressure 104/76, pulse 93, height 5\' 5"  (1.651 m), weight 116 lb (52.6 kg).Body mass index is 19.3 kg/m.  General Appearance: Casual and Fairly Groomed  Eye Contact:  Good  Speech:  Clear and Coherent  Volume:  Normal  Mood:  Euthymic  Affect:  Congruent and Flat baseline is flat  Thought Process:  Goal Directed  Orientation:  Full (Time, Place, and Person)  Thought Content: Logical   Suicidal Thoughts:  No  Homicidal Thoughts:  No  Memory:  Immediate;   Good  Judgement:  Good  Insight:  Fair  Psychomotor Activity:  Normal  Concentration:  Concentration: Fair  Recall:  [place] of Knowledge: Good  Language: Fair  Akathisia:  Negative  Handed:  Right  AIMS (if indicated):  0  Assets:  Communication  Skills Desire for Improvement Financial Resources/Insurance Housing Leisure Time Physical Health Resilience Social Support Talents/Skills  ADL's:  Intact  Cognition: WNL  Sleep:  6-7 hours    Treatment Plan Summary: [name] is a 21 year old female with a psychiatric history consistent with OCD with mixed obsessional thoughts and acts, who presents today for psychiatric follow-up. She has been adherent to Prozac 20 mg daily for the last 4 weeks, and notes some nice improvements and a reduction of rumination and obsessive thinking. She continues to have room for improvement, and I'd like her to go to 40 mg daily, but  encouraged her to do so when she feels comfortable. She is still getting used to some of the GI side effects from Prozac, but overall has tolerated it without issue. No acute safety issues and we will follow-up in 6 weeks.  She continues in therapy weekly with Jola Babinski.  1. Mixed obsessional thoughts and acts    Prozac increased to 40 mg daily as tolerated Benadryl nightly if needed for sleep difficulties or vivid dreams Continue in therapy weekly Homework assignments as listed in patient instructions Follow-up in 6 weeks  Aundra Dubin, MD 04/18/2017, 4:32 PM

## 2017-05-22 NOTE — Progress Notes (Signed)
Chief Complaint  Patient presents with  . Annual Exam    HPI: Patient  [name]  21 y.o. comes in today for Preventive Health Care visit    Under rx for anxiety and ocd tendencies has therapist and new psych now on fluoxetine  Doing much better   20 mg and sometimes inc to 40   Fear of needles prefers not unless necessary. Taking vit d   .   Health Maintenance  Topic Date Due  . PAP SMEAR  03/07/2015  . TETANUS/TDAP  08/03/2015  . HIV Screening  05/23/2018 (Originally 03/06/2009)  . INFLUENZA VACCINE  06/13/2017   Health Maintenance Review LIFESTYLE:  Exercise:  Walking   q d 8 .   Tobacco/ETS: no Alcohol:  no Sugar beverages:water  Sleep: 3-12  Drug use: no HH of  3 pets  3 cats a dog  school 2 classes  And one in person . This fall  Periods   Late once  But   Last  5 days .  No sig cramps.  No sa  Denies      ROS:  ocass hard to urinate   No dysuria or  On going  ocass gis    In past prob from  anxiety  Some loose stool  w initiation of  prozac  GEN/ HEENT: No fever, significant weight changes sweats headaches vision problems hearing changes, CV/ PULM; No chest pain shortness of breath cough, syncope,edema  change in exercise tolerance. GI /GU: No adominal pain, vomiting, change in bowel habits. No blood in the stool. No significant GU symptoms. SKIN/HEME: ,no acute skin rashes suspicious lesions or bleeding. No lymphadenopathy, nodules, masses.  NEURO/ PSYCH:  No neurologic signs such as weakness numbness. No depression . IMM/ Allergy: No unusual infections.  Allergy .   REST of 12 system review negative except as per HPI   Past Medical History:  Diagnosis Date  . Acute maxillary sinusitis   . Adjustment disorder with mixed anxiety and depressed mood    hx cutting  . Allergic rhinitis   . Anemia   . Chest discomfort   . Dizziness   . Family history of endocrine disorder   . Family history of schizophrenia   . Family history of thyroid disease   .  Fatigue   . HTN (hypertension), benign   . Mourning   . Muscle fasciculation   . OCD (obsessive compulsive disorder)   . Palpitations   . POTS (postural orthostatic tachycardia syndrome)   . SOB (shortness of breath)   . Tachycardia   . Vitamin D deficiency     Past Surgical History:  Procedure Laterality Date  . NO PAST SURGERIES      Family History  Problem Relation Age of Onset  . Hyperlipidemia Mother   . Asthma Sister   . Depression Sister   . Colon cancer Maternal Grandmother        Colon  . Diabetes Maternal Grandmother   . Schizophrenia Maternal Grandmother   . Miscarriages / Stillbirths Maternal Grandmother   . Varicose Veins Maternal Grandmother   . Diabetes Maternal Grandfather   . Hearing loss Maternal Grandfather   . Hyperlipidemia Maternal Grandfather   . Stroke Maternal Grandfather   . Dementia Maternal Grandfather   . Cancer Paternal Grandfather        Esophageal Cancer  . Mental illness Sister     Social History   Social History  . Marital status: Single  Spouse name: N/A  . Number of children: 0  . Years of education: 81   Social History Main Topics  . Smoking status: Never Smoker  . Smokeless tobacco: Never Used  . Alcohol use No  . Drug use: No  . Sexual activity: No   Other Topics Concern  . None   Social History Narrative   Currently in college studying, online going part-time.  Working on 6 credits.   Lives at home with mom and her sister in [place].   Has one cat   Unemployed.    Single, no kids       Outpatient Medications Prior to Visit  Medication Sig Dispense Refill  . cetirizine (ZYRTEC) 10 MG tablet Take 10 mg by mouth daily.    Marland Kitchen FLUoxetine (PROZAC) 20 MG capsule Take 2 capsules (40 mg total) by mouth daily. 90 capsule 1  . Multiple Vitamins-Minerals (MULTIVITAMIN ADULT PO) Take 1 tablet by mouth daily.    . Omega-3 Fatty Acids (OMEGA 3 PO) Take 1 tablet by mouth daily.     No facility-administered medications prior  to visit.      EXAM:  BP 114/72 (BP Location: Right Arm, Patient Position: Sitting, Cuff Size: Normal)   Pulse (!) 101   Temp 98.4 F (36.9 C) (Oral)   Ht 5' 5.5" (1.664 m)   Wt 118 lb 6.4 oz (53.7 kg)   BMI 19.40 kg/m   Body mass index is 19.4 kg/m. Wt Readings from Last 3 Encounters:  05/23/17 118 lb 6.4 oz (53.7 kg)  04/18/17 116 lb (52.6 kg)  03/15/17 117 lb 3.2 oz (53.2 kg)    Physical Exam: Vital signs reviewed QQV:ZDGL is a well-developed well-nourished alert cooperative    who appearsr stated age in no acute distress.  HEENT: normocephalic atraumatic , Eyes: PERRL EOM's full, conjunctiva clear glasses , Nares: paten,t no deformity discharge or tenderness., Ears: no deformity EAC's clear TMs with normal landmarks. Mouth: clear OP, no lesions, edema.  Moist mucous membranes. Dentition in adequate repair. NECK: supple without masses, thyromegaly or bruits. CHEST/PULM:  Clear to auscultation and percussion breath sounds equal no wheeze , rales or rhonchi. No chest wall deformities or tenderness. Breast: normal by inspection . No dimpling, discharge, masses, tenderness or discharge . CV: PMI is nondisplaced, S1 S2 no gallops, rate  About 86  murmurs, rubs. Peripheral pulses are full without delay.No JVD .  ABDOMEN: Bowel sounds normal nontender  No guard or rebound, no hepato splenomegal no CVA tenderness.  No hernia. Extremtities:  No clubbing cyanosis or edema, no acute joint swelling or redness no focal atrophy NEURO:  Oriented x3, cranial nerves 3-12 appear to be intact, no obvious focal weakness,gait within normal limits no abnormal reflexes or asymmetrical SKIN: No acute rashes normal turgor, color, no bruising or petechiae.   Acne lower face chin some inflammatory hiar nl  Left chest has  Very dark mole flat  But pencil eraser size  PSYCH: Oriented, good eye contact, no obvious depression  Min anxious  , cognition and judgment appear normal. LN: no cervical axillary  inguinal adenopathy  Lab Results  Component Value Date   WBC 9.2 11/17/2015   HGB 14.8 11/17/2015   HCT 43.8 11/17/2015   PLT 281 11/17/2015   GLUCOSE 121 (H) 11/17/2015   CHOL 206 (H) 11/03/2014   TRIG 111.0 11/03/2014   HDL 50.50 11/03/2014   LDLCALC 133 (H) 11/03/2014   ALT 10 11/03/2014   AST 15 11/03/2014  NA 142 11/17/2015   K 3.7 11/17/2015   CL 107 11/17/2015   CREATININE 0.80 11/17/2015   BUN 7 11/17/2015   CO2 25 11/17/2015   TSH 1.819 11/17/2015    BP Readings from Last 3 Encounters:  05/23/17 114/72  04/18/17 104/76  03/15/17 122/68    Lab results reviewed with patient   ASSESSMENT AND PLAN:  Discussed the following assessment and plan:  Visit for preventive health examination  Social anxiety disorder  Skin mole - see text   get derm to see  Reviewed  Blood testing and  riks seems more than benefit based on  Exam and discussion   Last check no anemia  Lipids a few years ago .  No blood work ordered today  Disc pap  Pt declines" not needed. " tdap seems to be due  She wants to wait  For now  Mom also aware .  continue vit d  mviron  And healthy eating ( fam hx of dm lipids etc) Change in meds seems to help her other anxiety sx a good bit.  Shared Decision Making See derm  Reg dnetal care   she  Seems to be doing much better and more confident .  Patient Care Team: Panosh, Standley Brooking, MD as PCP - General Fay Records, MD as Consulting Physician (Cardiology) Patient Instructions  Continue your vitamin D may want to take extra iron in your diet. See a dermatologist about the mole in the left chest and they can also address her acne worse better we can. Tetanus shot booster is given every 10 years Pap is usually done at age 57 but you have minimal risk so that can be delayed or see a gynecologist. Jim Desanctis you are doing better.         Preventive Care for [place], Female The transition to life after high school as a young adult can be a  stressful time with many changes. You may start seeing a primary care physician instead of a pediatrician. This is the time when your health care becomes your responsibility. Preventive care refers to lifestyle choices and visits with your health care provider that can promote health and wellness. What does preventive care include?  A yearly physical exam. This is also called an annual wellness visit.  Dental exams once or twice a year.  Routine eye exams. Ask your health care provider how often you should have your eyes checked.  Personal lifestyle choices, including: ? Daily care of your teeth and gums. ? Regular physical activity. ? Eating a healthy diet. ? Avoiding tobacco and drug use. ? Avoiding or limiting alcohol use. ? Practicing safe sex. ? Taking vitamin and mineral supplements as recommended by your health care provider. What happens during an annual wellness visit? Preventive care starts with a yearly visit to your primary care physician. The services and screenings done by your health care provider during your annual wellness visit will depend on your overall health, lifestyle risk factors, and family history of disease. Counseling Your health care provider may ask you questions about:  Past medical problems and your family's medical history.  Medicines or supplements you take.  Health insurance and access to health care.  Alcohol, tobacco, and drug use.  Your safety at home, work, or school.  Access to firearms.  Emotional well-being and how you cope with stress.  Relationship well-being.  Diet, exercise, and sleep habits.  Your sexual health and activity.  Your methods of birth  control.  Your menstrual cycle.  Your pregnancy history.  Screening You may have the following tests or measurements:  Height, weight, and BMI.  Blood pressure.  Lipid and cholesterol levels.  Tuberculosis skin test.  Skin exam.  Vision and hearing  tests.  Screening test for hepatitis.  Screening tests for sexually transmitted diseases (STDs), if you are at risk.  BRCA-related cancer screening. This may be done if you have a family history of breast, ovarian, tubal, or peritoneal cancers.  Pelvic exam and Pap test. This may be done every 3 years starting at age 60.  Vaccines Your health care provider may recommend certain vaccines, such as:  Influenza vaccine. This is recommended every year.  Tetanus, diphtheria, and acellular pertussis (Tdap, Td) vaccine. You may need a Td booster every 10 years.  Varicella vaccine. You may need this if you have not been vaccinated.  HPV vaccine. If you are 73 or younger, you may need three doses over 6 months.  Measles, mumps, and rubella (MMR) vaccine. You may need at least one dose of MMR. You may also need a second dose.  Pneumococcal 13-valent conjugate (PCV13) vaccine. You may need this if you have certain conditions and were not previously vaccinated.  Pneumococcal polysaccharide (PPSV23) vaccine. You may need one or two doses if you smoke cigarettes or if you have certain conditions.  Meningococcal vaccine. One dose is recommended if you are age 59-21 years and a first-year college student living in a residence hall, or if you have one of several medical conditions. You may also need additional booster doses.  Hepatitis A vaccine. You may need this if you have certain conditions or if you travel or work in places where you may be exposed to hepatitis A.  Hepatitis B vaccine. You may need this if you have certain conditions or if you travel or work in places where you may be exposed to hepatitis B.  Haemophilus influenzae type b (Hib) vaccine. You may need this if you have certain risk factors.  Talk to your health care provider about which screenings and vaccines you need and how often you need them. What steps can I take to develop healthy behaviors?  Have regular preventive  health care visits with your primary care physician and dentist.  Eat a healthy diet.  Drink enough fluid to keep your urine clear or pale yellow.  Stay active. Exercise at least 30 minutes 5 or more days of the week.  Use alcohol responsibly.  Maintain a healthy weight.  Do not use any products that contain nicotine, such as cigarettes, chewing tobacco, and e-cigarettes. If you need help quitting, ask your health care provider.  Do not use drugs.  Practice safe sex.  Use birth control (contraception) to prevent unwanted pregnancy. If you plan to become pregnant, see your health care provider for a pre-conception visit.  Find healthy ways to manage stress. How can I protect myself from injury? Injuries from violence or accidents are the leading cause of death among young adults and can often be prevented. Take these steps to help protect yourself:  Always wear your seat belt while driving or riding in a vehicle.  Do not drive if you have been drinking alcohol. Do not ride with someone who has been drinking.  Do not drive when you are tired or distracted. Do not text while driving.  Wear a helmet and other protective equipment during sports activities.  If you have firearms in your house, make sure  you follow all gun safety procedures.  Seek help if you have been bullied, physically abused, or sexually abused.  Use the Internet responsibly to avoid dangers such as online bullying and online sexual predators.  What can I do to cope with stress? Young adults may face many new challenges that can be stressful, such as finding a job, going to college, moving away from home, managing money, being in a relationship, getting married, and having children. To manage stress:  Avoid known stressful situations when you can.  Exercise regularly.  Find a stress-reducing activity that works best for you. Examples include meditation, yoga, listening to music, or reading.  Spend time in  nature.  Keep a journal to write about your stress and how you respond.  Talk to your health care provider about stress. He or she may suggest counseling.  Spend time with supportive friends or family.  Do not cope with stress by: ? Drinking alcohol or using drugs. ? Smoking cigarettes. ? Eating.  Where can I get more information? Learn more about preventive care and healthy habits from:  [place] and Gynecologists: KaraokeExchange.nl  U.S. Probation officer Task Force: StageSync.si  National Adolescent and [place]: StrategicRoad.nl  American Academy of Pediatrics Bright Futures: https://brightfutures.MemberVerification.co.za  Society for Adolescent Health and Medicine: MoralBlog.co.za.aspx  PodExchange.nl: ToyLending.fr  This information is not intended to replace advice given to you by your health care provider. Make sure you discuss any questions you have with your health care provider. Document Released: 03/16/2016 Document Revised: 04/06/2016 Document Reviewed: 03/16/2016 Elsevier Interactive Patient Education  2017 [place]. Panosh M.D.

## 2017-05-23 ENCOUNTER — Encounter: Payer: Self-pay | Admitting: Internal Medicine

## 2017-05-23 ENCOUNTER — Ambulatory Visit (INDEPENDENT_AMBULATORY_CARE_PROVIDER_SITE_OTHER): Payer: BLUE CROSS/BLUE SHIELD | Admitting: Internal Medicine

## 2017-05-23 VITALS — BP 114/72 | HR 101 | Temp 98.4°F | Ht 65.5 in | Wt 118.4 lb

## 2017-05-23 DIAGNOSIS — Z0001 Encounter for general adult medical examination with abnormal findings: Secondary | ICD-10-CM | POA: Diagnosis not present

## 2017-05-23 DIAGNOSIS — F4010 Social phobia, unspecified: Secondary | ICD-10-CM

## 2017-05-23 DIAGNOSIS — D229 Melanocytic nevi, unspecified: Secondary | ICD-10-CM

## 2017-05-23 DIAGNOSIS — Z0000 Encounter for general adult medical examination without abnormal findings: Secondary | ICD-10-CM

## 2017-05-23 NOTE — Patient Instructions (Addendum)
Continue your vitamin D may want to take extra iron in your diet. See a dermatologist about the mole in the left chest and they can also address her acne worse better we can. Tetanus shot booster is given every 10 years Pap is usually done at age 23 but you have minimal risk so that can be delayed or see a gynecologist. Jim Desanctis you are doing better.         Preventive Care for [place], Female The transition to life after high school as a young adult can be a stressful time with many changes. You may start seeing a primary care physician instead of a pediatrician. This is the time when your health care becomes your responsibility. Preventive care refers to lifestyle choices and visits with your health care provider that can promote health and wellness. What does preventive care include?  A yearly physical exam. This is also called an annual wellness visit.  Dental exams once or twice a year.  Routine eye exams. Ask your health care provider how often you should have your eyes checked.  Personal lifestyle choices, including: ? Daily care of your teeth and gums. ? Regular physical activity. ? Eating a healthy diet. ? Avoiding tobacco and drug use. ? Avoiding or limiting alcohol use. ? Practicing safe sex. ? Taking vitamin and mineral supplements as recommended by your health care provider. What happens during an annual wellness visit? Preventive care starts with a yearly visit to your primary care physician. The services and screenings done by your health care provider during your annual wellness visit will depend on your overall health, lifestyle risk factors, and family history of disease. Counseling Your health care provider may ask you questions about:  Past medical problems and your family's medical history.  Medicines or supplements you take.  Health insurance and access to health care.  Alcohol, tobacco, and drug use.  Your safety at home, work, or school.  Access  to firearms.  Emotional well-being and how you cope with stress.  Relationship well-being.  Diet, exercise, and sleep habits.  Your sexual health and activity.  Your methods of birth control.  Your menstrual cycle.  Your pregnancy history.  Screening You may have the following tests or measurements:  Height, weight, and BMI.  Blood pressure.  Lipid and cholesterol levels.  Tuberculosis skin test.  Skin exam.  Vision and hearing tests.  Screening test for hepatitis.  Screening tests for sexually transmitted diseases (STDs), if you are at risk.  BRCA-related cancer screening. This may be done if you have a family history of breast, ovarian, tubal, or peritoneal cancers.  Pelvic exam and Pap test. This may be done every 3 years starting at age 19.  Vaccines Your health care provider may recommend certain vaccines, such as:  Influenza vaccine. This is recommended every year.  Tetanus, diphtheria, and acellular pertussis (Tdap, Td) vaccine. You may need a Td booster every 10 years.  Varicella vaccine. You may need this if you have not been vaccinated.  HPV vaccine. If you are 54 or younger, you may need three doses over 6 months.  Measles, mumps, and rubella (MMR) vaccine. You may need at least one dose of MMR. You may also need a second dose.  Pneumococcal 13-valent conjugate (PCV13) vaccine. You may need this if you have certain conditions and were not previously vaccinated.  Pneumococcal polysaccharide (PPSV23) vaccine. You may need one or two doses if you smoke cigarettes or if you have certain conditions.  Meningococcal vaccine. One dose is recommended if you are age 72-21 years and a first-year college student living in a residence hall, or if you have one of several medical conditions. You may also need additional booster doses.  Hepatitis A vaccine. You may need this if you have certain conditions or if you travel or work in places where you may be exposed  to hepatitis A.  Hepatitis B vaccine. You may need this if you have certain conditions or if you travel or work in places where you may be exposed to hepatitis B.  Haemophilus influenzae type b (Hib) vaccine. You may need this if you have certain risk factors.  Talk to your health care provider about which screenings and vaccines you need and how often you need them. What steps can I take to develop healthy behaviors?  Have regular preventive health care visits with your primary care physician and dentist.  Eat a healthy diet.  Drink enough fluid to keep your urine clear or pale yellow.  Stay active. Exercise at least 30 minutes 5 or more days of the week.  Use alcohol responsibly.  Maintain a healthy weight.  Do not use any products that contain nicotine, such as cigarettes, chewing tobacco, and e-cigarettes. If you need help quitting, ask your health care provider.  Do not use drugs.  Practice safe sex.  Use birth control (contraception) to prevent unwanted pregnancy. If you plan to become pregnant, see your health care provider for a pre-conception visit.  Find healthy ways to manage stress. How can I protect myself from injury? Injuries from violence or accidents are the leading cause of death among young adults and can often be prevented. Take these steps to help protect yourself:  Always wear your seat belt while driving or riding in a vehicle.  Do not drive if you have been drinking alcohol. Do not ride with someone who has been drinking.  Do not drive when you are tired or distracted. Do not text while driving.  Wear a helmet and other protective equipment during sports activities.  If you have firearms in your house, make sure you follow all gun safety procedures.  Seek help if you have been bullied, physically abused, or sexually abused.  Use the Internet responsibly to avoid dangers such as online bullying and online sexual predators.  What can I do to cope  with stress? Young adults may face many new challenges that can be stressful, such as finding a job, going to college, moving away from home, managing money, being in a relationship, getting married, and having children. To manage stress:  Avoid known stressful situations when you can.  Exercise regularly.  Find a stress-reducing activity that works best for you. Examples include meditation, yoga, listening to music, or reading.  Spend time in nature.  Keep a journal to write about your stress and how you respond.  Talk to your health care provider about stress. He or she may suggest counseling.  Spend time with supportive friends or family.  Do not cope with stress by: ? Drinking alcohol or using drugs. ? Smoking cigarettes. ? Eating.  Where can I get more information? Learn more about preventive care and healthy habits from:  [place] and Gynecologists: KaraokeExchange.nl  U.S. Probation officer Task Force: StageSync.si  National Adolescent and [place]: StrategicRoad.nl  American Academy of Pediatrics Bright Futures: https://brightfutures.MemberVerification.co.za  Society for Adolescent Health and Medicine: MoralBlog.co.za.aspx  PodExchange.nl: ToyLending.fr  This information is  not intended to replace advice given to you by your health care provider. Make sure you discuss any questions you have with your health care provider. Document Released: 03/16/2016 Document Revised: 04/06/2016 Document Reviewed: 03/16/2016 Elsevier Interactive Patient Education  2017 [employer].

## 2017-06-13 ENCOUNTER — Ambulatory Visit (HOSPITAL_COMMUNITY): Payer: Self-pay | Admitting: Psychiatry

## 2017-08-03 ENCOUNTER — Encounter: Payer: Self-pay | Admitting: Internal Medicine

## 2017-08-09 ENCOUNTER — Ambulatory Visit (INDEPENDENT_AMBULATORY_CARE_PROVIDER_SITE_OTHER): Payer: BLUE CROSS/BLUE SHIELD | Admitting: Psychiatry

## 2017-08-09 DIAGNOSIS — Z810 Family history of intellectual disabilities: Secondary | ICD-10-CM | POA: Diagnosis not present

## 2017-08-09 DIAGNOSIS — F4010 Social phobia, unspecified: Secondary | ICD-10-CM | POA: Diagnosis not present

## 2017-08-09 DIAGNOSIS — Z560 Unemployment, unspecified: Secondary | ICD-10-CM

## 2017-08-09 DIAGNOSIS — Z818 Family history of other mental and behavioral disorders: Secondary | ICD-10-CM | POA: Diagnosis not present

## 2017-08-09 DIAGNOSIS — F422 Mixed obsessional thoughts and acts: Secondary | ICD-10-CM

## 2017-08-09 NOTE — Progress Notes (Signed)
[facility] MD/PA/NP OP Progress Note  08/09/2017 4:09 PM [name]  MRN:  [id number]  Chief Complaint: med check  HPI: [name] reports that the Prozac 20 mg daily has been well tolerated, and she kept the dose at 20 mg. She reports that her mood has remained steady, denies any suicidality or depressive symptoms. She reports that things are going well overall with her, and she is going to be taking her driver's license test soon. She went out with a group of friends about 2 weeks ago, for a [employer] get together, with her guild that was in town so they were to able to meet up in person and go to the [employer] and get food together. She reports that this was a very positive experience. She denies any self-harm or suicidal thoughts, denies any paranoia or psychotic symptoms. She reports that she feels comfortable with the medicine at the current dose.  Spent time discussing some future strategies and where she sees herself in 1 year from now. She would like to get a job and has been looking at jobs in Scientist, research (medical) and in [employer] and at stores. She is interested in a job at the [employer] if she can get one there. She reports that she and her sister continue to have some issues with getting along, they tend to avoid each other. There is no violence between them. She reports that in 1 year from now she hopes that she'll be driving more often and be more independent.  Visit Diagnosis:    ICD-10-CM   1. Social anxiety disorder F40.10   2. Mixed obsessional thoughts and acts F42.2     Past Psychiatric History: See intake H&P for full details. Reviewed, with no updates at this time.   Past Medical History:  Past Medical History:  Diagnosis Date  . Acute maxillary sinusitis   . Adjustment disorder with mixed anxiety and depressed mood    hx cutting  . Allergic rhinitis   . Anemia   . Chest discomfort   . Dizziness   . Family history of endocrine disorder   . Family  history of schizophrenia   . Family history of thyroid disease   . Fatigue   . HTN (hypertension), benign   . Mourning   . Muscle fasciculation   . OCD (obsessive compulsive disorder)   . Palpitations   . POTS (postural orthostatic tachycardia syndrome)   . SOB (shortness of breath)   . Tachycardia   . Vitamin D deficiency     Past Surgical History:  Procedure Laterality Date  . NO PAST SURGERIES      Family Psychiatric History: See intake H&P for full details. Reviewed, with no updates at this time.   Family History:  Family History  Problem Relation Age of Onset  . Hyperlipidemia Mother   . Asthma Sister   . Depression Sister   . Colon cancer Maternal Grandmother        Colon  . Diabetes Maternal Grandmother   . Schizophrenia Maternal Grandmother   . Miscarriages / Stillbirths Maternal Grandmother   . Varicose Veins Maternal Grandmother   . Diabetes Maternal Grandfather   . Hearing loss Maternal Grandfather   . Hyperlipidemia Maternal Grandfather   . Stroke Maternal Grandfather   . Dementia Maternal Grandfather   . Cancer Paternal Grandfather        Esophageal Cancer  . Mental illness Sister     Social History:  Social History   Social History  . Marital status: Logan    Spouse name: N/A  . Number of children: 0  . Years of education: 16   Social History Main Topics  . Smoking status: Never Smoker  . Smokeless tobacco: Never Used  . Alcohol use No  . Drug use: No  . Sexual activity: No   Other Topics Concern  . Not on file   Social History Narrative   Currently in college studying, online going part-time.  Working on 6 credits.   Lives at home with mom and her sister in [place].   Has one cat   Unemployed.    Logan, no kids       Allergies:  Allergies  Allergen Reactions  . Penicillins Swelling    Has patient had a PCN reaction causing immediate rash, facial/tongue/throat swelling, SOB or lightheadedness with hypotension: Yes Has patient had  a PCN reaction causing severe rash involving mucus membranes or skin necrosis: Yes Has patient had a PCN reaction that required hospitalization No. Has patient had a PCN reaction occurring within the last 10 years: No. If all of the above answers are "NO", then may proceed with Cephalosporin use.     Metabolic Disorder Labs: No results found for: HGBA1C, MPG No results found for: PROLACTIN Lab Results  Component Value Date   CHOL 206 (H) 11/03/2014   TRIG 111.0 11/03/2014   HDL 50.50 11/03/2014   CHOLHDL 4 11/03/2014   VLDL 22.2 11/03/2014   LDLCALC 133 (H) 11/03/2014   LDLCALC 75 09/11/2007   Lab Results  Component Value Date   TSH 1.819 11/17/2015   TSH 2.66 11/03/2014    Therapeutic Level Labs: No results found for: LITHIUM No results found for: VALPROATE No components found for:  CBMZ  Current Medications: Current Outpatient Prescriptions  Medication Sig Dispense Refill  . cetirizine (ZYRTEC) 10 MG tablet Take 10 mg by mouth daily.    Marland Kitchen FLUoxetine (PROZAC) 20 MG capsule Take 2 capsules (40 mg total) by mouth daily. 90 capsule 1  . Multiple Vitamins-Minerals (MULTIVITAMIN ADULT PO) Take 1 tablet by mouth daily.    . Omega-3 Fatty Acids (OMEGA 3 PO) Take 1 tablet by mouth daily.     No current facility-administered medications for this visit.      Musculoskeletal: Strength & Muscle Tone: within normal limits Gait & Station: normal Patient leans: N/A  Psychiatric Specialty Exam: Review of Systems  Constitutional: Negative.   HENT: Negative.   Respiratory: Negative.   Cardiovascular: Negative.   Gastrointestinal: Negative.   Musculoskeletal: Negative.   Neurological: Negative.   Psychiatric/Behavioral: Negative.     There were no vitals taken for this visit.There is no height or weight on file to calculate BMI.  General Appearance: Casual and Fairly Groomed  Eye Contact:  Fair  Speech:  Clear and Coherent  Volume:  Normal  Mood:  Euthymic  Affect:   Blunt and Congruent  Thought Process:  Goal Directed  Orientation:  Full (Time, Place, and Person)  Thought Content: Logical   Suicidal Thoughts:  No  Homicidal Thoughts:  No  Memory:  Immediate;   Good  Judgement:  Good  Insight:  Fair  Psychomotor Activity:  Normal  Concentration:  Concentration: Good  Recall:  Good  Fund of Knowledge: Good  Language: Good  Akathisia:  Negative  Handed:  Right  AIMS (if indicated): not done  Assets:  Communication Skills Desire for Improvement Financial Resources/Insurance Housing Physical Health Talents/Skills  Vocational/Educational  ADL's:  Intact  Cognition: WNL  Sleep:  Good   Screenings: PHQ2-9     Office Visit from 11/10/2016 in Primary Care at [facility] Total Score  0       Assessment and Plan: [name] is a 21 year old female with a history of OCD with obsessional thoughts and acts, in addition to social anxiety disorder, who appears to be doing well on Prozac 20 mg daily, with stable mood and a reduction in obsessive thinking. She is gradually beginning to expose herself to more social interactions. No significant side effects at this time. No acute safety issues at this time and she continues in individual therapy.  We'll follow-up in 3 months.  1. Social anxiety disorder   2. Mixed obsessional thoughts and acts    Continue Prozac 20 mg daily Continue individual therapy Return to clinic in 3 months  Aundra Dubin, MD 08/09/2017, 4:09 PM

## 2017-09-14 ENCOUNTER — Other Ambulatory Visit (HOSPITAL_COMMUNITY): Payer: Self-pay

## 2017-09-14 DIAGNOSIS — F422 Mixed obsessional thoughts and acts: Secondary | ICD-10-CM

## 2017-09-14 MED ORDER — FLUOXETINE HCL 20 MG PO CAPS: 40.0000 mg | ORAL_CAPSULE | Freq: Every day | ORAL | 0 refills | Status: DC

## 2017-11-01 ENCOUNTER — Ambulatory Visit (INDEPENDENT_AMBULATORY_CARE_PROVIDER_SITE_OTHER): Payer: BLUE CROSS/BLUE SHIELD | Admitting: Psychiatry

## 2017-11-01 ENCOUNTER — Encounter (HOSPITAL_COMMUNITY): Payer: Self-pay | Admitting: Psychiatry

## 2017-11-01 VITALS — BP 110/68 | HR 88 | Ht 65.0 in | Wt 123.0 lb

## 2017-11-01 DIAGNOSIS — F422 Mixed obsessional thoughts and acts: Secondary | ICD-10-CM | POA: Diagnosis not present

## 2017-11-01 DIAGNOSIS — Z79899 Other long term (current) drug therapy: Secondary | ICD-10-CM

## 2017-11-01 DIAGNOSIS — F4010 Social phobia, unspecified: Secondary | ICD-10-CM

## 2017-11-01 DIAGNOSIS — Z818 Family history of other mental and behavioral disorders: Secondary | ICD-10-CM | POA: Diagnosis not present

## 2017-11-01 DIAGNOSIS — F411 Generalized anxiety disorder: Secondary | ICD-10-CM | POA: Diagnosis not present

## 2017-11-01 MED ORDER — FLUOXETINE HCL 40 MG PO CAPS: 40.0000 mg | ORAL_CAPSULE | Freq: Every day | ORAL | 1 refills | Status: DC

## 2017-11-01 NOTE — Progress Notes (Signed)
[facility] MD/PA/NP OP Progress Note  11/01/2017 3:50 PM [name]  MRN:  [id number]  Chief Complaint Doing better HPI: Patient reports that overall she is doing much better, but has had a flare in OCD symptoms, more ruminations and checking.  She requests an increase Prozac to 40 mg.  I applauded her insight and taking a big step in recognizing that she would benefit from an increase.  Reviewed the risks and benefits, and the common side effects.  She has been able to tolerate Prozac without any intolerance thus far.  She denies any safety issues, denies any auditory or visual hallucinations, or paranoia.  She reports that she is scheduled to take her driver's test in February, and is actually looking forward to it, and has been looking for job opportunities so that she can make money and increase her sense of independence.  Visit Diagnosis:    ICD-10-CM   1. Social anxiety disorder F40.10   2. Mixed obsessional thoughts and acts F42.2 FLUoxetine (PROZAC) 40 MG capsule  3. GAD (generalized anxiety disorder) F41.1     Past Psychiatric History: See intake H&P for full details. Reviewed, with no updates at this time.   Past Medical History:  Past Medical History:  Diagnosis Date  . Acute maxillary sinusitis   . Adjustment disorder with mixed anxiety and depressed mood    hx cutting  . Allergic rhinitis   . Anemia   . Chest discomfort   . Dizziness   . Family history of endocrine disorder   . Family history of schizophrenia   . Family history of thyroid disease   . Fatigue   . HTN (hypertension), benign   . Mourning   . Muscle fasciculation   . OCD (obsessive compulsive disorder)   . Palpitations   . POTS (postural orthostatic tachycardia syndrome)   . SOB (shortness of breath)   . Tachycardia   . Vitamin D deficiency     Past Surgical History:  Procedure Laterality Date  . NO PAST SURGERIES      Family Psychiatric History: See intake H&P for full details. Reviewed, with no  updates at this time.   Family History:  Family History  Problem Relation Age of Onset  . Hyperlipidemia Mother   . Asthma Sister   . Depression Sister   . Colon cancer Maternal Grandmother        Colon  . Diabetes Maternal Grandmother   . Schizophrenia Maternal Grandmother   . Miscarriages / Stillbirths Maternal Grandmother   . Varicose Veins Maternal Grandmother   . Diabetes Maternal Grandfather   . Hearing loss Maternal Grandfather   . Hyperlipidemia Maternal Grandfather   . Stroke Maternal Grandfather   . Dementia Maternal Grandfather   . Cancer Paternal Grandfather        Esophageal Cancer  . Mental illness Sister     Social History:  Social History   Socioeconomic History  . Marital status: Single    Spouse name: None  . Number of children: 0  . Years of education: 3  . Highest education level: None  Social Needs  . Financial resource strain: None  . Food insecurity - worry: None  . Food insecurity - inability: None  . Transportation needs - medical: None  . Transportation needs - non-medical: None  Occupational History  . None  Tobacco Use  . Smoking status: Never Smoker  . Smokeless tobacco: Never Used  Substance and Sexual Activity  . Alcohol use: No  Alcohol/week: 0.0 oz  . Drug use: No  . Sexual activity: No  Other Topics Concern  . None  Social History Narrative   Currently in college studying, online going part-time.  Working on 6 credits.   Lives at home with mom and her sister in [place].   Has one cat   Unemployed.    Single, no kids    Allergies:  Allergies  Allergen Reactions  . Penicillins Swelling    Has patient had a PCN reaction causing immediate rash, facial/tongue/throat swelling, SOB or lightheadedness with hypotension: Yes Has patient had a PCN reaction causing severe rash involving mucus membranes or skin necrosis: Yes Has patient had a PCN reaction that required hospitalization No. Has patient had a PCN reaction occurring  within the last 10 years: No. If all of the above answers are "NO", then may proceed with Cephalosporin use.     Metabolic Disorder Labs: No results found for: HGBA1C, MPG No results found for: PROLACTIN Lab Results  Component Value Date   CHOL 206 (H) 11/03/2014   TRIG 111.0 11/03/2014   HDL 50.50 11/03/2014   CHOLHDL 4 11/03/2014   VLDL 22.2 11/03/2014   LDLCALC 133 (H) 11/03/2014   LDLCALC 75 09/11/2007   Lab Results  Component Value Date   TSH 1.819 11/17/2015   TSH 2.66 11/03/2014    Therapeutic Level Labs: No results found for: LITHIUM No results found for: VALPROATE No components found for:  CBMZ  Current Medications: Current Outpatient Medications  Medication Sig Dispense Refill  . cetirizine (ZYRTEC) 10 MG tablet Take 10 mg by mouth daily.    Marland Kitchen FLUoxetine (PROZAC) 40 MG capsule Take 1 capsule (40 mg total) by mouth daily. 90 capsule 1  . Multiple Vitamins-Minerals (MULTIVITAMIN ADULT PO) Take 1 tablet by mouth daily.    . Omega-3 Fatty Acids (OMEGA 3 PO) Take 1 tablet by mouth daily.     No current facility-administered medications for this visit.      Musculoskeletal: Strength & Muscle Tone: within normal limits Gait & Station: normal Patient leans: N/A  Psychiatric Specialty Exam: ROS  Blood pressure 110/68, pulse 88, height 5\' 5"  (1.651 m), weight 123 lb (55.8 kg).Body mass index is 20.47 kg/m.  General Appearance: Casual and Fairly Groomed  Eye Contact:  Fair  Speech:  Clear and Coherent  Volume:  Normal  Mood:  Anxious and Euthymic  Affect:  Appropriate and Congruent  Thought Process:  Goal Directed and Descriptions of Associations: Intact  Orientation:  Full (Time, Place, and Person)  Thought Content: Logical   Suicidal Thoughts:  No  Homicidal Thoughts:  No  Memory:  Immediate;   Fair  Judgement:  Good  Insight:  Good  Psychomotor Activity:  Normal  Concentration:  Concentration: Good  Recall:  Good  Fund of Knowledge: Good   Language: Good  Akathisia:  Negative  Handed:  Right  AIMS (if indicated): not done  Assets:  Communication Skills Desire for Improvement Financial Resources/Insurance Housing  ADL's:  Intact  Cognition: WNL  Sleep:  Good   Screenings: PHQ2-9     Office Visit from 11/10/2016 in Primary Care at [facility]  PHQ-2 Total Score  0       Assessment and Plan:  [name] presents with interval improvements, and increasing recognition and insight into her needs.  She requests an increase in Prozac, and we agreed to increase to 40 mg.  No side effects at this time and she is sleeping well  at night.  No acute safety issues or substance use.  She has taken several major steps to increase her independence, including scheduling her driver's test for February so she can get her driver's license, and actively looking for a job outside of the home.  We will follow-up in 3-4 months or sooner if needed.  1. Social anxiety disorder   2. Mixed obsessional thoughts and acts   3. GAD (generalized anxiety disorder)     Status of current problems: gradually improving  Labs Ordered: No orders of the defined types were placed in this encounter.   Labs Reviewed: n/a  Collateral Obtained/Records Reviewed: n/a  Plan:  Increase Prozac to 40 mg Continue omega-3 Fish oil and M/V  I spent 15 minutes with the patient in direct face-to-face clinical care.  Greater than 50% of this time was spent in counseling and coordination of care with the patient.    Aundra Dubin, MD 11/01/2017, 3:50 PM

## 2017-11-05 NOTE — Progress Notes (Signed)
No chief complaint on file.   HPI: [name] 21 y.o. come in for problem visit   Noted about 6 weeks of waxing and waning bump ocass uncomfortable when walking her huskie dog    Noted more when standing up   No gu gyne sx otherwise and no redness or fever . No rx  ROS: See pertinent positives and negatives per HPI.  Past Medical History:  Diagnosis Date  . Acute maxillary sinusitis   . Adjustment disorder with mixed anxiety and depressed mood    hx cutting  . Allergic rhinitis   . Anemia   . Chest discomfort   . Dizziness   . Family history of endocrine disorder   . Family history of schizophrenia   . Family history of thyroid disease   . Fatigue   . HTN (hypertension), benign   . Mourning   . Muscle fasciculation   . OCD (obsessive compulsive disorder)   . Palpitations   . POTS (postural orthostatic tachycardia syndrome)   . SOB (shortness of breath)   . Tachycardia   . Vitamin D deficiency     Family History  Problem Relation Age of Onset  . Hyperlipidemia Mother   . Asthma Sister   . Depression Sister   . Colon cancer Maternal Grandmother        Colon  . Diabetes Maternal Grandmother   . Schizophrenia Maternal Grandmother   . Miscarriages / Stillbirths Maternal Grandmother   . Varicose Veins Maternal Grandmother   . Diabetes Maternal Grandfather   . Hearing loss Maternal Grandfather   . Hyperlipidemia Maternal Grandfather   . Stroke Maternal Grandfather   . Dementia Maternal Grandfather   . Cancer Paternal Grandfather        Esophageal Cancer  . Mental illness Sister     Social History   Socioeconomic History  . Marital status: Single    Spouse name: None  . Number of children: 0  . Years of education: 30  . Highest education level: None  Social Needs  . Financial resource strain: None  . Food insecurity - worry: None  . Food insecurity - inability: None  . Transportation needs - medical: None  . Transportation needs - non-medical: None    Occupational History  . None  Tobacco Use  . Smoking status: Never Smoker  . Smokeless tobacco: Never Used  Substance and Sexual Activity  . Alcohol use: No    Alcohol/week: 0.0 oz  . Drug use: No  . Sexual activity: No  Other Topics Concern  . None  Social History Narrative   Currently in college studying, online going part-time.  Working on 6 credits.   Lives at home with mom and her sister in [place].   Has one cat   Unemployed.    Single, no kids    Outpatient Medications Prior to Visit  Medication Sig Dispense Refill  . cetirizine (ZYRTEC) 10 MG tablet Take 10 mg by mouth daily.    Marland Kitchen FLUoxetine (PROZAC) 40 MG capsule Take 1 capsule (40 mg total) by mouth daily. 90 capsule 1  . Multiple Vitamins-Minerals (MULTIVITAMIN ADULT PO) Take 1 tablet by mouth daily.    . Omega-3 Fatty Acids (OMEGA 3 PO) Take 1 tablet by mouth daily.     No facility-administered medications prior to visit.      EXAM:  BP 120/82 (BP Location: Right Arm, Patient Position: Sitting, Cuff Size: Normal)   Pulse (!) 103   Temp 98.4  F (36.9 C) (Oral)   Wt 124 lb (56.2 kg)   BMI 20.63 kg/m   Body mass index is 20.63 kg/m.  GENERAL: vitals reviewed and listed above, alert, oriented, appears well hydrated and in no acute distress HEENT: atraumatic, conjunctiva  clear, no obvious abnormalities on inspection of external nose and ears MS: moves all extremities without noticeable focal  Abnormality abd soft  Ext gu shaved   Some pigment post inflammatory changes noted And noted left  Inguinal area low a fullness  About less than 1 cm  No nenter   When supine  tinuy mobile nodes  No masses     Labia normal  PSYCH: pleasant and cooperative, no obvious depression   BP Readings from Last 3 Encounters:  11/07/17 120/82  05/23/17 114/72  01/08/17 108/70    ASSESSMENT AND PLAN:  Discussed the following assessment and plan:  Inguinal swelling - minimal when satnding  at intertrigenous area  ?   intermittent  infected aden cyst will follow at this time  no alarm findings  -Patient advised to return or notify health care team  if  new concerns arise.  Patient Instructions  I dont hink this is serious and could be a small gland    Or cyst related to hair bump . If getting red and more swollen can add antibiotic   For infection trigger.  Otherwise lets wathc this are over the next  2 months  .  If stays the same and not bothering you then continue to observe.  If  Getting larger or tender then plan recheck the area.         Standley Brooking. [name] M.D.

## 2017-11-07 ENCOUNTER — Encounter: Payer: Self-pay | Admitting: Internal Medicine

## 2017-11-07 ENCOUNTER — Ambulatory Visit: Payer: BLUE CROSS/BLUE SHIELD | Admitting: Internal Medicine

## 2017-11-07 VITALS — BP 120/82 | HR 103 | Temp 98.4°F | Wt 124.0 lb

## 2017-11-07 DIAGNOSIS — R1909 Other intra-abdominal and pelvic swelling, mass and lump: Secondary | ICD-10-CM | POA: Diagnosis not present

## 2017-11-07 MED ORDER — DOXYCYCLINE HYCLATE 100 MG PO TABS: 100.0000 mg | ORAL_TABLET | Freq: Two times a day (BID) | ORAL | 0 refills | Status: DC

## 2017-11-07 NOTE — Patient Instructions (Addendum)
I dont hink this is serious and could be a small gland    Or cyst related to hair bump . If getting red and more swollen can add antibiotic   For infection trigger.  Otherwise lets wathc this are over the next  2 months  .  If stays the same and not bothering you then continue to observe.  If  Getting larger or tender then plan recheck the area.

## 2018-01-31 ENCOUNTER — Ambulatory Visit (HOSPITAL_COMMUNITY): Payer: Self-pay | Admitting: Psychiatry

## 2018-02-14 ENCOUNTER — Ambulatory Visit (HOSPITAL_COMMUNITY): Payer: BLUE CROSS/BLUE SHIELD | Admitting: Psychiatry

## 2018-04-24 ENCOUNTER — Ambulatory Visit (INDEPENDENT_AMBULATORY_CARE_PROVIDER_SITE_OTHER): Payer: Self-pay | Admitting: Psychiatry

## 2018-04-24 ENCOUNTER — Encounter (HOSPITAL_COMMUNITY): Payer: Self-pay | Admitting: Psychiatry

## 2018-04-24 DIAGNOSIS — Z818 Family history of other mental and behavioral disorders: Secondary | ICD-10-CM

## 2018-04-24 DIAGNOSIS — F422 Mixed obsessional thoughts and acts: Secondary | ICD-10-CM

## 2018-04-24 DIAGNOSIS — F4010 Social phobia, unspecified: Secondary | ICD-10-CM

## 2018-04-24 DIAGNOSIS — Z810 Family history of intellectual disabilities: Secondary | ICD-10-CM

## 2018-04-24 DIAGNOSIS — F411 Generalized anxiety disorder: Secondary | ICD-10-CM

## 2018-04-24 MED ORDER — FLUOXETINE HCL 40 MG PO CAPS: 40.0000 mg | ORAL_CAPSULE | Freq: Every day | ORAL | 1 refills | Status: DC

## 2018-04-24 NOTE — Progress Notes (Signed)
[facility] [facility] MD/PA/NP OP Progress Note  04/24/2018 4:31 PM [name]  MRN:  [id number]  Chief Complaint: med check  HPI: [name] reports she is doing really quite well, has had a substantial reduce in her social anxiety, she went and got her driver's license, she has gone on her own out to social activities, she has made up with her dad.  She is going to a gaming convention in October.  She feels quite good on the Prozac 40 mg and is sleeping well at night.  Her self-confidence and self-esteem is much better.  No safety concerns, and OCD symptoms are much improved.  She appears generally more confident and calm.  We agreed to follow-up in 3 months or sooner if needed.  Visit Diagnosis:    ICD-10-CM   1. GAD (generalized anxiety disorder) F41.1   2. Mixed obsessional thoughts and acts F42.2 FLUoxetine (PROZAC) 40 MG capsule  3. Social anxiety disorder F40.10     Past Psychiatric History: See intake H&P for full details. Reviewed, with no updates at this time.   Past Medical History:  Past Medical History:  Diagnosis Date  . Acute maxillary sinusitis   . Adjustment disorder with mixed anxiety and depressed mood    hx cutting  . Allergic rhinitis   . Anemia   . Chest discomfort   . Dizziness   . Family history of endocrine disorder   . Family history of schizophrenia   . Family history of thyroid disease   . Fatigue   . HTN (hypertension), benign   . Mourning   . Muscle fasciculation   . OCD (obsessive compulsive disorder)   . Palpitations   . POTS (postural orthostatic tachycardia syndrome)   . SOB (shortness of breath)   . Tachycardia   . Vitamin D deficiency     Past Surgical History:  Procedure Laterality Date  . NO PAST SURGERIES      Family Psychiatric History: See intake H&P for full details. Reviewed, with no updates at this time.   Family History:  Family History  Problem Relation Age of Onset  . Hyperlipidemia Mother   . Asthma Sister   . Depression  Sister   . Colon cancer Maternal Grandmother        Colon  . Diabetes Maternal Grandmother   . Schizophrenia Maternal Grandmother   . Miscarriages / Stillbirths Maternal Grandmother   . Varicose Veins Maternal Grandmother   . Diabetes Maternal Grandfather   . Hearing loss Maternal Grandfather   . Hyperlipidemia Maternal Grandfather   . Stroke Maternal Grandfather   . Dementia Maternal Grandfather   . Cancer Paternal Grandfather        Esophageal Cancer  . Mental illness Sister     Social History:  Social History   Socioeconomic History  . Marital status: Single    Spouse name: Not on file  . Number of children: 0  . Years of education: 37  . Highest education level: Not on file  Occupational History  . Not on file  Social Needs  . Financial resource strain: Not on file  . Food insecurity:    Worry: Not on file    Inability: Not on file  . Transportation needs:    Medical: Not on file    Non-medical: Not on file  Tobacco Use  . Smoking status: Never Smoker  . Smokeless tobacco: Never Used  Substance and Sexual Activity  . Alcohol use: No    Alcohol/week:  0.0 oz  . Drug use: No  . Sexual activity: Never  Lifestyle  . Physical activity:    Days per week: Not on file    Minutes per session: Not on file  . Stress: Not on file  Relationships  . Social connections:    Talks on phone: Not on file    Gets together: Not on file    Attends religious service: Not on file    Active member of club or organization: Not on file    Attends meetings of clubs or organizations: Not on file    Relationship status: Not on file  Other Topics Concern  . Not on file  Social History Narrative   Currently in college studying, online going part-time.  Working on 6 credits.   Lives at home with mom and her sister in [place].   Has one cat   Unemployed.    Single, no kids    Allergies:  Allergies  Allergen Reactions  . Penicillins Swelling    Has patient had a PCN reaction  causing immediate rash, facial/tongue/throat swelling, SOB or lightheadedness with hypotension: Yes Has patient had a PCN reaction causing severe rash involving mucus membranes or skin necrosis: Yes Has patient had a PCN reaction that required hospitalization No. Has patient had a PCN reaction occurring within the last 10 years: No. If all of the above answers are "NO", then may proceed with Cephalosporin use.     Metabolic Disorder Labs: No results found for: HGBA1C, MPG No results found for: PROLACTIN Lab Results  Component Value Date   CHOL 206 (H) 11/03/2014   TRIG 111.0 11/03/2014   HDL 50.50 11/03/2014   CHOLHDL 4 11/03/2014   VLDL 22.2 11/03/2014   LDLCALC 133 (H) 11/03/2014   LDLCALC 75 09/11/2007   Lab Results  Component Value Date   TSH 1.819 11/17/2015   TSH 2.66 11/03/2014    Therapeutic Level Labs: No results found for: LITHIUM No results found for: VALPROATE No components found for:  CBMZ  Current Medications: Current Outpatient Medications  Medication Sig Dispense Refill  . cetirizine (ZYRTEC) 10 MG tablet Take 10 mg by mouth daily.    Marland Kitchen doxycycline (VIBRA-TABS) 100 MG tablet Take 1 tablet (100 mg total) by mouth 2 (two) times daily. If needed for skin infection 14 tablet 0  . FLUoxetine (PROZAC) 40 MG capsule Take 1 capsule (40 mg total) by mouth daily. 90 capsule 1  . Multiple Vitamins-Minerals (MULTIVITAMIN ADULT PO) Take 1 tablet by mouth daily.    . Omega-3 Fatty Acids (OMEGA 3 PO) Take 1 tablet by mouth daily.     No current facility-administered medications for this visit.      Musculoskeletal: Strength & Muscle Tone: within normal limits Gait & Station: normal Patient leans: N/A  Psychiatric Specialty Exam: ROS  There were no vitals taken for this visit.There is no height or weight on file to calculate BMI.  General Appearance: Casual and Fairly Groomed  Eye Contact:  Fair  Speech:  Clear and Coherent  Volume:  Normal  Mood:  Euthymic   Affect:  Appropriate and Congruent  Thought Process:  Goal Directed and Descriptions of Associations: Intact  Orientation:  Full (Time, Place, and Person)  Thought Content: Logical   Suicidal Thoughts:  No  Homicidal Thoughts:  No  Memory:  Immediate;   Fair  Judgement:  Good  Insight:  Good  Psychomotor Activity:  Normal  Concentration:  Concentration: Good  Recall:  Good  Fund of Knowledge: Good  Language: Good  Akathisia:  Negative  Handed:  Right  AIMS (if indicated): not done  Assets:  Communication Skills Desire for Improvement Financial Resources/Insurance Housing  ADL's:  Intact  Cognition: WNL  Sleep:  Good   Screenings: PHQ2-9     Office Visit from 11/10/2016 in [facility] at [facility]  PHQ-2 Total Score  0       Assessment and Plan:  [name] presents with substantial improvements on Prozac 40 mg, symptoms are largely controlled and she is taking very positive steps in terms of getting out of the house, increased socialization, looking for a job, has obtained her driver's license, and connecting more with peers her age.  She has begun to date a young man around her age for the past couple weeks and feels more confident.  She is sleeping well at night and denies any significant side effects from Prozac.  Disclosed to patient that this Probation officer is leaving this practice at the end of August 2019, and patients always has the right to choose their provider. Reassured patient that office will work to provide smooth transition of care whether they wish to remain at this office, or to continue with this provider, or seek alternative care options in community.  They expressed understanding.   1. GAD (generalized anxiety disorder)   2. Mixed obsessional thoughts and acts   3. Social anxiety disorder     Status of current problems: gradually improving  Labs Ordered: No orders of the defined types were placed in this encounter.   Labs Reviewed: n/a  Collateral  Obtained/Records Reviewed: n/a  Plan:  Increase Prozac to 40 mg Continue omega-3 Fish oil and M/V  I spent 15 minutes with the patient in direct face-to-face clinical care.  Greater than 50% of this time was spent in counseling and coordination of care with the patient.    Aundra Dubin, MD 04/24/2018, 4:31 PM

## 2018-05-10 NOTE — Progress Notes (Deleted)
No chief complaint on file.   HPI: Patient  [name]  22 y.o. comes in today for Preventive Health Care visit   Health Maintenance  Topic Date Due  . PAP SMEAR  03/07/2015  . TETANUS/TDAP  08/03/2015  . HIV Screening  05/23/2018 (Originally 03/06/2009)  . INFLUENZA VACCINE  06/13/2018   Health Maintenance Review LIFESTYLE:  Exercise:   Tobacco/ETS: Alcohol:  Sugar beverages: Sleep: Drug use: no HH of  Work:    ROS:  GEN/ HEENT: No fever, significant weight changes sweats headaches vision problems hearing changes, CV/ PULM; No chest pain shortness of breath cough, syncope,edema  change in exercise tolerance. GI /GU: No adominal pain, vomiting, change in bowel habits. No blood in the stool. No significant GU symptoms. SKIN/HEME: ,no acute skin rashes suspicious lesions or bleeding. No lymphadenopathy, nodules, masses.  NEURO/ PSYCH:  No neurologic signs such as weakness numbness. No depression anxiety. IMM/ Allergy: No unusual infections.  Allergy .   REST of 12 system review negative except as per HPI   Past Medical History:  Diagnosis Date  . Acute maxillary sinusitis   . Adjustment disorder with mixed anxiety and depressed mood    hx cutting  . Allergic rhinitis   . Anemia   . Chest discomfort   . Dizziness   . Family history of endocrine disorder   . Family history of schizophrenia   . Family history of thyroid disease   . Fatigue   . HTN (hypertension), benign   . Mourning   . Muscle fasciculation   . OCD (obsessive compulsive disorder)   . Palpitations   . POTS (postural orthostatic tachycardia syndrome)   . SOB (shortness of breath)   . Tachycardia   . Vitamin D deficiency     Past Surgical History:  Procedure Laterality Date  . NO PAST SURGERIES      Family History  Problem Relation Age of Onset  . Hyperlipidemia Mother   . Asthma Sister   . Depression Sister   . Colon cancer Maternal Grandmother        Colon  . Diabetes Maternal  Grandmother   . Schizophrenia Maternal Grandmother   . Miscarriages / Stillbirths Maternal Grandmother   . Varicose Veins Maternal Grandmother   . Diabetes Maternal Grandfather   . Hearing loss Maternal Grandfather   . Hyperlipidemia Maternal Grandfather   . Stroke Maternal Grandfather   . Dementia Maternal Grandfather   . Cancer Paternal Grandfather        Esophageal Cancer  . Mental illness Sister     Social History   Socioeconomic History  . Marital status: Single    Spouse name: Not on file  . Number of children: 0  . Years of education: 56  . Highest education level: Not on file  Occupational History  . Not on file  Social Needs  . Financial resource strain: Not on file  . Food insecurity:    Worry: Not on file    Inability: Not on file  . Transportation needs:    Medical: Not on file    Non-medical: Not on file  Tobacco Use  . Smoking status: Never Smoker  . Smokeless tobacco: Never Used  Substance and Sexual Activity  . Alcohol use: No    Alcohol/week: 0.0 oz  . Drug use: No  . Sexual activity: Never  Lifestyle  . Physical activity:    Days per week: Not on file    Minutes per session:  Not on file  . Stress: Not on file  Relationships  . Social connections:    Talks on phone: Not on file    Gets together: Not on file    Attends religious service: Not on file    Active member of club or organization: Not on file    Attends meetings of clubs or organizations: Not on file    Relationship status: Not on file  Other Topics Concern  . Not on file  Social History Narrative   Currently in college studying, online going part-time.  Working on 6 credits.   Lives at home with mom and her sister in [place].   Has one cat   Unemployed.    Single, no kids    Outpatient Medications Prior to Visit  Medication Sig Dispense Refill  . cetirizine (ZYRTEC) 10 MG tablet Take 10 mg by mouth daily.    Marland Kitchen doxycycline (VIBRA-TABS) 100 MG tablet Take 1 tablet (100 mg total)  by mouth 2 (two) times daily. If needed for skin infection 14 tablet 0  . FLUoxetine (PROZAC) 40 MG capsule Take 1 capsule (40 mg total) by mouth daily. 90 capsule 1  . Multiple Vitamins-Minerals (MULTIVITAMIN ADULT PO) Take 1 tablet by mouth daily.    . Omega-3 Fatty Acids (OMEGA 3 PO) Take 1 tablet by mouth daily.     No facility-administered medications prior to visit.      EXAM:  There were no vitals taken for this visit.  There is no height or weight on file to calculate BMI. Wt Readings from Last 3 Encounters:  11/07/17 124 lb (56.2 kg)  05/23/17 118 lb 6.4 oz (53.7 kg)  01/08/17 116 lb 12.8 oz (53 kg)    Physical Exam: Vital signs reviewed GYI:RSWN is a well-developed well-nourished alert cooperative    who appearsr stated age in no acute distress.  HEENT: normocephalic atraumatic , Eyes: PERRL EOM's full, conjunctiva clear, Nares: paten,t no deformity discharge or tenderness., Ears: no deformity EAC's clear TMs with normal landmarks. Mouth: clear OP, no lesions, edema.  Moist mucous membranes. Dentition in adequate repair. NECK: supple without masses, thyromegaly or bruits. CHEST/PULM:  Clear to auscultation and percussion breath sounds equal no wheeze , rales or rhonchi. No chest wall deformities or tenderness. Breast: normal by inspection . No dimpling, discharge, masses, tenderness or discharge . CV: PMI is nondisplaced, S1 S2 no gallops, murmurs, rubs. Peripheral pulses are full without delay.No JVD .  ABDOMEN: Bowel sounds normal nontender  No guard or rebound, no hepato splenomegal no CVA tenderness.  No hernia. Extremtities:  No clubbing cyanosis or edema, no acute joint swelling or redness no focal atrophy NEURO:  Oriented x3, cranial nerves 3-12 appear to be intact, no obvious focal weakness,gait within normal limits no abnormal reflexes or asymmetrical SKIN: No acute rashes normal turgor, color, no bruising or petechiae. PSYCH: Oriented, good eye contact, no obvious  depression anxiety, cognition and judgment appear normal. LN: no cervical axillary inguinal adenopathy  Lab Results  Component Value Date   WBC 9.2 11/17/2015   HGB 14.8 11/17/2015   HCT 43.8 11/17/2015   PLT 281 11/17/2015   GLUCOSE 121 (H) 11/17/2015   CHOL 206 (H) 11/03/2014   TRIG 111.0 11/03/2014   HDL 50.50 11/03/2014   LDLCALC 133 (H) 11/03/2014   ALT 10 11/03/2014   AST 15 11/03/2014   NA 142 11/17/2015   K 3.7 11/17/2015   CL 107 11/17/2015   CREATININE 0.80 11/17/2015  BUN 7 11/17/2015   CO2 25 11/17/2015   TSH 1.819 11/17/2015    BP Readings from Last 3 Encounters:  11/07/17 120/82  05/23/17 114/72  01/08/17 108/70    Lab results reviewed with patient   ASSESSMENT AND PLAN:  Discussed the following assessment and plan:  Visit for preventive health examination  Patient Care Team: Doran Nestle, Standley Brooking, MD as PCP - Desma Paganini, MD as Consulting Physician (Cardiology) There are no Patient Instructions on file for this visit.  Standley Brooking. Skye Rodarte M.D.

## 2018-05-13 ENCOUNTER — Encounter: Payer: Self-pay | Admitting: Internal Medicine

## 2018-05-13 DIAGNOSIS — Z0289 Encounter for other administrative examinations: Secondary | ICD-10-CM

## 2019-12-16 ENCOUNTER — Other Ambulatory Visit: Payer: Self-pay

## 2019-12-17 ENCOUNTER — Ambulatory Visit (INDEPENDENT_AMBULATORY_CARE_PROVIDER_SITE_OTHER): Payer: 59 | Admitting: Internal Medicine

## 2019-12-17 ENCOUNTER — Encounter: Payer: Self-pay | Admitting: Internal Medicine

## 2019-12-17 VITALS — BP 116/62 | HR 98 | Temp 98.4°F | Ht 65.0 in | Wt 140.6 lb

## 2019-12-17 DIAGNOSIS — Z5320 Procedure and treatment not carried out because of patient's decision for unspecified reasons: Secondary | ICD-10-CM

## 2019-12-17 DIAGNOSIS — Z0000 Encounter for general adult medical examination without abnormal findings: Secondary | ICD-10-CM

## 2019-12-17 DIAGNOSIS — Z833 Family history of diabetes mellitus: Secondary | ICD-10-CM | POA: Diagnosis not present

## 2019-12-17 DIAGNOSIS — Z79899 Other long term (current) drug therapy: Secondary | ICD-10-CM | POA: Diagnosis not present

## 2019-12-17 DIAGNOSIS — F411 Generalized anxiety disorder: Secondary | ICD-10-CM | POA: Diagnosis not present

## 2019-12-17 DIAGNOSIS — Z23 Encounter for immunization: Secondary | ICD-10-CM | POA: Diagnosis not present

## 2019-12-17 LAB — BASIC METABOLIC PANEL
BUN: 8 mg/dL (ref 6–23)
CO2: 29 mEq/L (ref 19–32)
Calcium: 9.6 mg/dL (ref 8.4–10.5)
Chloride: 105 mEq/L (ref 96–112)
Creatinine, Ser: 0.81 mg/dL (ref 0.40–1.20)
GFR: 85.62 mL/min (ref >60.00)
Glucose, Bld: 87 mg/dL (ref 70–99)
Potassium: 4.8 mEq/L (ref 3.5–5.1)
Sodium: 139 mEq/L (ref 135–145)

## 2019-12-17 LAB — CBC WITH DIFFERENTIAL/PLATELET
Basophils Absolute: 0.1 10*3/uL (ref 0.0–0.1)
Basophils Relative: 1.4 % (ref 0.0–3.0)
Eosinophils Absolute: 0.2 10*3/uL (ref 0.0–0.7)
Eosinophils Relative: 3.2 % (ref 0.0–5.0)
HCT: 41.1 % (ref 36.0–46.0)
Hemoglobin: 14 g/dL (ref 12.0–15.0)
Lymphocytes Relative: 29.7 % (ref 12.0–46.0)
Lymphs Abs: 1.4 10*3/uL (ref 0.7–4.0)
MCHC: 33.9 g/dL (ref 30.0–36.0)
MCV: 88.3 fl (ref 78.0–100.0)
Monocytes Absolute: 0.4 10*3/uL (ref 0.1–1.0)
Monocytes Relative: 8.4 % (ref 3.0–12.0)
Neutro Abs: 2.7 10*3/uL (ref 1.4–7.7)
Neutrophils Relative %: 57.3 % (ref 43.0–77.0)
Platelets: 334 10*3/uL (ref 150.0–400.0)
RBC: 4.65 Mil/uL (ref 3.87–5.11)
RDW: 13.8 % (ref 11.5–15.5)
WBC: 4.7 10*3/uL (ref 4.0–10.5)

## 2019-12-17 LAB — HEPATIC FUNCTION PANEL
ALT: 10 U/L (ref 0–35)
AST: 13 U/L (ref 0–37)
Albumin: 4.7 g/dL (ref 3.5–5.2)
Alkaline Phosphatase: 48 U/L (ref 39–117)
Bilirubin, Direct: 0.1 mg/dL (ref 0.0–0.3)
Total Bilirubin: 0.7 mg/dL (ref 0.2–1.2)
Total Protein: 6.9 g/dL (ref 6.0–8.3)

## 2019-12-17 LAB — LIPID PANEL
Cholesterol: 237 mg/dL — ABNORMAL HIGH (ref 0–200)
HDL: 75.7 mg/dL (ref >39.00)
LDL Cholesterol: 146 mg/dL — ABNORMAL HIGH (ref 0–99)
NonHDL: 160.91
Total CHOL/HDL Ratio: 3
Triglycerides: 74 mg/dL (ref 0.0–149.0)
VLDL: 14.8 mg/dL (ref 0.0–40.0)

## 2019-12-17 LAB — T4, FREE: Free T4: 0.7 ng/dL (ref 0.60–1.60)

## 2019-12-17 LAB — TSH: TSH: 2.4 u[IU]/mL (ref 0.35–4.50)

## 2019-12-17 NOTE — Patient Instructions (Signed)
Will notify you  of labs when available.   We can prescribe  Medication prozac at this time   Do virtual visit in 6 months for med check  And yearly   Physical  Your exam is good but you may benefit from exercise stretching like yoga  Or  Upper body resistance  Exercises.   Let us know if any problems are  persistent or progressive     Preventive Care 59-26 Years Old, Female Preventive care refers to visits with your health care provider and lifestyle choices that can promote health and wellness. This includes:  A yearly physical exam. This may also be called an annual well check.  Regular dental visits and eye exams.  Immunizations.  Screening for certain conditions.  Healthy lifestyle choices, such as eating a healthy diet, getting regular exercise, not using drugs or products that contain nicotine and tobacco, and limiting alcohol use. What can I expect for my preventive care visit? Physical exam Your health care provider will check your:  Height and weight. This may be used to calculate body mass index (BMI), which tells if you are at a healthy weight.  Heart rate and blood pressure.  Skin for abnormal spots. Counseling Your health care provider may ask you questions about your:  Alcohol, tobacco, and drug use.  Emotional well-being.  Home and relationship well-being.  Sexual activity.  Eating habits.  Work and work Statistician.  Method of birth control.  Menstrual cycle.  Pregnancy history. What immunizations do I need?  Influenza (flu) vaccine  This is recommended every year. Tetanus, diphtheria, and pertussis (Tdap) vaccine  You may need a Td booster every 10 years. Varicella (chickenpox) vaccine  You may need this if you have not been vaccinated. Human papillomavirus (HPV) vaccine  If recommended by your health care provider, you may need three doses over 6 months. Measles, mumps, and rubella (MMR) vaccine  You may need at least one dose of  MMR. You may also need a second dose. Meningococcal conjugate (MenACWY) vaccine  One dose is recommended if you are age 53-21 years and a first-year college student living in a residence hall, or if you have one of several medical conditions. You may also need additional booster doses. Pneumococcal conjugate (PCV13) vaccine  You may need this if you have certain conditions and were not previously vaccinated. Pneumococcal polysaccharide (PPSV23) vaccine  You may need one or two doses if you smoke cigarettes or if you have certain conditions. Hepatitis A vaccine  You may need this if you have certain conditions or if you travel or work in places where you may be exposed to hepatitis A. Hepatitis B vaccine  You may need this if you have certain conditions or if you travel or work in places where you may be exposed to hepatitis B. Haemophilus influenzae type b (Hib) vaccine  You may need this if you have certain conditions. You may receive vaccines as individual doses or as more than one vaccine together in one shot (combination vaccines). Talk with your health care provider about the risks and benefits of combination vaccines. What tests do I need?  Blood tests  Lipid and cholesterol levels. These may be checked every 5 years starting at age 74.  Hepatitis C test.  Hepatitis B test. Screening  Diabetes screening. This is done by checking your blood sugar (glucose) after you have not eaten for a while (fasting).  Sexually transmitted disease (STD) testing.  BRCA-related cancer screening. This may  be done if you have a family history of breast, ovarian, tubal, or peritoneal cancers.  Pelvic exam and Pap test. This may be done every 3 years starting at age 33. Starting at age 14, this may be done every 5 years if you have a Pap test in combination with an HPV test. Talk with your health care provider about your test results, treatment options, and if necessary, the need for more  tests. Follow these instructions at home: Eating and drinking   Eat a diet that includes fresh fruits and vegetables, whole grains, lean protein, and low-fat dairy.  Take vitamin and mineral supplements as recommended by your health care provider.  Do not drink alcohol if: ? Your health care provider tells you not to drink. ? You are pregnant, may be pregnant, or are planning to become pregnant.  If you drink alcohol: ? Limit how much you have to 0-1 drink a day. ? Be aware of how much alcohol is in your drink. In the [place] one drink equals one 12 oz bottle of beer (355 mL), one 5 oz glass of wine (148 mL), or one 1 oz glass of hard liquor (44 mL). Lifestyle  Take daily care of your teeth and gums.  Stay active. Exercise for at least 30 minutes on 5 or more days each week.  Do not use any products that contain nicotine or tobacco, such as cigarettes, e-cigarettes, and chewing tobacco. If you need help quitting, ask your health care provider.  If you are sexually active, practice safe sex. Use a condom or other form of birth control (contraception) in order to prevent pregnancy and STIs (sexually transmitted infections). If you plan to become pregnant, see your health care provider for a preconception visit. What's next?  Visit your health care provider once a year for a well check visit.  Ask your health care provider how often you should have your eyes and teeth checked.  Stay up to date on all vaccines. This information is not intended to replace advice given to you by your health care provider. Make sure you discuss any questions you have with your health care provider. Document Revised: 07/11/2018 Document Reviewed: 07/11/2018 Elsevier Patient Education  2020 [employer].

## 2019-12-17 NOTE — Progress Notes (Signed)
Blood sugar is good ,  no anemia  thyroid normal .  Cholesterol is mildly elevated  with favorable ratio     Making sure  healthy eating and exercise can  bring this down

## 2019-12-17 NOTE — Progress Notes (Signed)
This visit occurred during the SARS-CoV-2 public health emergency.  Safety protocols were in place, including screening questions prior to the visit, additional usage of staff PPE, and extensive cleaning of exam room while observing appropriate contact time as indicated for disinfecting solutions.    Chief Complaint  Patient presents with  . Annual Exam    Pt has no concerns today    HPI: Patient  [name]  24 y.o. comes in today for Preventive Health Care visit  Last ov with me 12 2018 Here for cpx hcm  She is doing well on prozac 40 mg for  a while  No therapist    And psychiatrist  december  Drd  Eksir.  Advised that PCP can take over medication rx  Cause no need and stable .  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  About 5 days.  No SA  And declines pap ( donest think needed)   Health Maintenance  Topic Date Due  . HIV Screening  03/06/2009  . TETANUS/TDAP  08/03/2015  . PAP SMEAR-Modifier  12/17/2019 (Originally 03/07/2015)  . PAP-Cervical Cytology Screening  12/16/2020 (Originally 03/07/2015)  . INFLUENZA VACCINE  Completed   Health Maintenance Review LIFESTYLE:  Exercise:  Mostly inside trying to  get outside  Tobacco/ETS: no Alcohol: no Sugar beverages: mostly water  Sleep: 10 hours  At most . Latest 3 . In school  Drug use: no HH of 2 2 cats   Mom  ( sis living with father) Work ad  [employer] got canceled from covid :in school  cybercrime  associate.     ROS:  Mole has been checked on chest  Gets ssensi are in iguinal are if wears binding  Pants during period  ,   Also some difficulty when twisting upper body on left ? dsicomfort   No radiation injury of lof  GEN/ HEENT: No fever, significant weight changes sweats headaches vision problems hearing changes, CV/ PULM; No chest pain shortness of breath cough, syncope,edema  change in exercise tolerance. GI /GU: No adominal pain, vomiting, change in bowel habits.  No blood in the stool. No significant GU symptoms. SKIN/HEME: ,no acute skin rashes suspicious lesions or bleeding. No lymphadenopathy, nodules, masses.  NEURO/ PSYCH:  No neurologic signs such as weakness numbness. No depression anxiety. IMM/ Allergy: No unusual infections.  Allergy .   REST of 12 system review negative except as per HPI   Past Medical History:  Diagnosis Date  . Acute maxillary sinusitis   . Adjustment disorder with mixed anxiety and depressed mood    hx cutting  . Allergic rhinitis   . Anemia   . Chest discomfort   . Dizziness   . Family history of endocrine disorder   . Family history of schizophrenia   . Family history of thyroid disease   . Fatigue   . HTN (hypertension), benign   . Mourning   . Muscle fasciculation   . OCD (obsessive compulsive disorder)   . Palpitations   . POTS (postural orthostatic tachycardia syndrome)   . SOB (shortness of breath)   . Tachycardia   . Vitamin D deficiency     Past Surgical History:  Procedure Laterality Date  . NO PAST SURGERIES      Family History  Problem Relation Age of Onset  . Hyperlipidemia Mother   . Asthma Sister   . Depression Sister   .  Colon cancer Maternal Grandmother        Colon  . Diabetes Maternal Grandmother   . Schizophrenia Maternal Grandmother   . Miscarriages / Stillbirths Maternal Grandmother   . Varicose Veins Maternal Grandmother   . Diabetes Maternal Grandfather   . Hearing loss Maternal Grandfather   . Hyperlipidemia Maternal Grandfather   . Stroke Maternal Grandfather   . Dementia Maternal Grandfather   . Cancer Paternal Grandfather        Esophageal Cancer  . Mental illness Sister       Outpatient Medications Prior to Visit  Medication Sig Dispense Refill  . cetirizine (ZYRTEC) 10 MG tablet Take 10 mg by mouth daily.    . Multiple Vitamins-Minerals (MULTIVITAMIN ADULT PO) Take 1 tablet by mouth daily.    . Omega-3 Fatty Acids (OMEGA 3 PO) Take 1 tablet by mouth  daily.    Marland Kitchen FLUoxetine (PROZAC) 40 MG capsule Take 1 capsule (40 mg total) by mouth daily. 90 capsule 1  . doxycycline (VIBRA-TABS) 100 MG tablet Take 1 tablet (100 mg total) by mouth 2 (two) times daily. If needed for skin infection (Patient not taking: Reported on 12/17/2019) 14 tablet 0   No facility-administered medications prior to visit.     EXAM:  BP 116/62 (BP Location: Right Arm, Patient Position: Sitting, Cuff Size: Normal)   Pulse 98   Temp 98.4 F (36.9 C) (Temporal)   Ht 5' 5"  (1.651 m)   Wt 140 lb 9.6 oz (63.8 kg)   SpO2 97%   BMI 23.40 kg/m   Body mass index is 23.4 kg/m. Wt Readings from Last 3 Encounters:  12/17/19 140 lb 9.6 oz (63.8 kg)  11/07/17 124 lb (56.2 kg)  05/23/17 118 lb 6.4 oz (53.7 kg)    Physical Exam: Vital signs reviewed OAC:ZYSA is a well-developed well-nourished alert cooperative    who appearsr stated age in no acute distress.  HEENT: normocephalic atraumatic , Eyes: PERRL EOM's full, conjunctiva clear, , Ears: no deformity EAC's clear TMs with normal landmarks. Mouth:masked  NECK: supple without masses, thyromegaly or bruits. CHEST/PULM:  Clear to auscultation and percussion breath sounds equal no wheeze , rales or rhonchi. No chest wall deformities or tenderness. Breast: normal by inspection . No dimpling, discharge, masses, tenderness or discharge . CV: PMI is nondisplaced, S1 S2 no gallops, murmurs, rubs. Peripheral pulses are full without delay.No JVD .  ABDOMEN: Bowel sounds normal nontender  No guard or rebound, no hepato splenomegal no CVA tenderness.  No hernia. Extremtities:  No clubbing cyanosis or edema, no acute joint swelling or redness no focal atrophy no scoliosis but when upright left scalupa slightly higher than left  But nl otherwise and tends to sit hunched   NEURO:  Oriented x3, cranial nerves 3-12 appear to be intact, no obvious focal weakness,gait within normal limits no abnormal reflexes or asymmetrical SKIN: No acute  rashes normal turgor, color, no bruising or petechiae. PSYCH: Oriented, good eye contact, no obvious depression anxiety, cognition and judgment appear normal. LN: no cervical axillary inguinal adenopathy  Lab Results  Component Value Date   WBC 4.7 12/17/2019   HGB 14.0 12/17/2019   HCT 41.1 12/17/2019   PLT 334.0 12/17/2019   GLUCOSE 87 12/17/2019   CHOL 237 (H) 12/17/2019   TRIG 74.0 12/17/2019   HDL 75.70 12/17/2019   LDLCALC 146 (H) 12/17/2019   ALT 10 12/17/2019   AST 13 12/17/2019   NA 139 12/17/2019   K 4.8 12/17/2019  CL 105 12/17/2019   CREATININE 0.81 12/17/2019   BUN 8 12/17/2019   CO2 29 12/17/2019   TSH 2.40 12/17/2019    BP Readings from Last 3 Encounters:  12/17/19 116/62  11/07/17 120/82  05/23/17 114/72   Fasting. Lab results reviewed with patient   ASSESSMENT AND PLAN:  Discussed the following assessment and plan:    ICD-10-CM   1. Visit for preventive health examination  D53.29 Basic metabolic panel    CBC with Differential/Platelet    Hepatic function panel    Lipid panel    TSH    T4, free  2. Need for immunization against influenza  Z23 Flu Vaccine QUAD 36+ mos IM  3. Family history of diabetes mellitus  J24.2 Basic metabolic panel    CBC with Differential/Platelet    Hepatic function panel    Lipid panel    TSH    T4, free  4. Medication management  Z79.899   5. GAD (generalized anxiety disorder)  F41.1   6. Pap smear of cervix declined  Z53.20    reasonable request see text   Exam  reassuring  Advise  Postural stretching exercises etc  And fu if problem  Screening labs fam hx of  Dm etc  Ok to take over the fluoxetine  Since stable at this time and rov Virtual in  about 6 months med check . reasonable to delay pap since no SA and low risk and no sx of import  Disc  Guidelines otherwise  Can see gyne or get pap etc here   Patient Care Team: Arrayah Connors, Standley Brooking, MD as PCP - General Fay Records, MD as Consulting Physician  (Cardiology) Patient Instructions  Will notify you  of labs when available.   We can prescribe  Medication prozac at this time   Do virtual visit in 6 months for med check  And yearly   Physical  Your exam is good but you may benefit from exercise stretching like yoga  Or  Upper body resistance  Exercises.   Let us know if any problems are  persistent or progressive     Preventive Care 73-38 Years Old, Female Preventive care refers to visits with your health care provider and lifestyle choices that can promote health and wellness. This includes:  A yearly physical exam. This may also be called an annual well check.  Regular dental visits and eye exams.  Immunizations.  Screening for certain conditions.  Healthy lifestyle choices, such as eating a healthy diet, getting regular exercise, not using drugs or products that contain nicotine and tobacco, and limiting alcohol use. What can I expect for my preventive care visit? Physical exam Your health care provider will check your:  Height and weight. This may be used to calculate body mass index (BMI), which tells if you are at a healthy weight.  Heart rate and blood pressure.  Skin for abnormal spots. Counseling Your health care provider may ask you questions about your:  Alcohol, tobacco, and drug use.  Emotional well-being.  Home and relationship well-being.  Sexual activity.  Eating habits.  Work and work Statistician.  Method of birth control.  Menstrual cycle.  Pregnancy history. What immunizations do I need?  Influenza (flu) vaccine  This is recommended every year. Tetanus, diphtheria, and pertussis (Tdap) vaccine  You may need a Td booster every 10 years. Varicella (chickenpox) vaccine  You may need this if you have not been vaccinated. Human papillomavirus (HPV) vaccine  If  recommended by your health care provider, you may need three doses over 6 months. Measles, mumps, and rubella (MMR)  vaccine  You may need at least one dose of MMR. You may also need a second dose. Meningococcal conjugate (MenACWY) vaccine  One dose is recommended if you are age 52-21 years and a first-year college student living in a residence hall, or if you have one of several medical conditions. You may also need additional booster doses. Pneumococcal conjugate (PCV13) vaccine  You may need this if you have certain conditions and were not previously vaccinated. Pneumococcal polysaccharide (PPSV23) vaccine  You may need one or two doses if you smoke cigarettes or if you have certain conditions. Hepatitis A vaccine  You may need this if you have certain conditions or if you travel or work in places where you may be exposed to hepatitis A. Hepatitis B vaccine  You may need this if you have certain conditions or if you travel or work in places where you may be exposed to hepatitis B. Haemophilus influenzae type b (Hib) vaccine  You may need this if you have certain conditions. You may receive vaccines as individual doses or as more than one vaccine together in one shot (combination vaccines). Talk with your health care provider about the risks and benefits of combination vaccines. What tests do I need?  Blood tests  Lipid and cholesterol levels. These may be checked every 5 years starting at age 52.  Hepatitis C test.  Hepatitis B test. Screening  Diabetes screening. This is done by checking your blood sugar (glucose) after you have not eaten for a while (fasting).  Sexually transmitted disease (STD) testing.  BRCA-related cancer screening. This may be done if you have a family history of breast, ovarian, tubal, or peritoneal cancers.  Pelvic exam and Pap test. This may be done every 3 years starting at age 71. Starting at age 46, this may be done every 5 years if you have a Pap test in combination with an HPV test. Talk with your health care provider about your test results, treatment  options, and if necessary, the need for more tests. Follow these instructions at home: Eating and drinking   Eat a diet that includes fresh fruits and vegetables, whole grains, lean protein, and low-fat dairy.  Take vitamin and mineral supplements as recommended by your health care provider.  Do not drink alcohol if: ? Your health care provider tells you not to drink. ? You are pregnant, may be pregnant, or are planning to become pregnant.  If you drink alcohol: ? Limit how much you have to 0-1 drink a day. ? Be aware of how much alcohol is in your drink. In the [place] one drink equals one 12 oz bottle of beer (355 mL), one 5 oz glass of wine (148 mL), or one 1 oz glass of hard liquor (44 mL). Lifestyle  Take daily care of your teeth and gums.  Stay active. Exercise for at least 30 minutes on 5 or more days each week.  Do not use any products that contain nicotine or tobacco, such as cigarettes, e-cigarettes, and chewing tobacco. If you need help quitting, ask your health care provider.  If you are sexually active, practice safe sex. Use a condom or other form of birth control (contraception) in order to prevent pregnancy and STIs (sexually transmitted infections). If you plan to become pregnant, see your health care provider for a preconception visit. What's next?  Visit your health  care provider once a year for a well check visit.  Ask your health care provider how often you should have your eyes and teeth checked.  Stay up to date on all vaccines. This information is not intended to replace advice given to you by your health care provider. Make sure you discuss any questions you have with your health care provider. Document Revised: 07/11/2018 Document Reviewed: 07/11/2018 Elsevier Patient Education  2020 [place] Belisa Eichholz M.D.

## 2020-03-03 ENCOUNTER — Telehealth: Payer: Self-pay | Admitting: Internal Medicine

## 2020-03-03 DIAGNOSIS — F422 Mixed obsessional thoughts and acts: Secondary | ICD-10-CM

## 2020-03-03 MED ORDER — FLUOXETINE HCL 40 MG PO CAPS: 40.0000 mg | ORAL_CAPSULE | Freq: Every day | ORAL | 1 refills | Status: DC

## 2020-03-03 NOTE — Telephone Encounter (Signed)
FLUoxetine (PROZAC) 40 MG capsule(Expired)  [facility] [place], [place] Phone:  [phone]  Fax:  [phone]     The pharmacy has faxed over a refill request also  The patient is out of this medication as of today

## 2020-03-03 NOTE — Telephone Encounter (Signed)
[facility] for [place] ot fill  90 days x 2   Sent in electronically .

## 2020-03-03 NOTE — Telephone Encounter (Signed)
Last OV 12/17/2019  This Rx was last filled on 04/24/2018, # 90 with 1 refill by Dr. Aundra Dubin

## 2020-05-07 ENCOUNTER — Telehealth: Payer: Self-pay | Admitting: Internal Medicine

## 2020-05-07 NOTE — Telephone Encounter (Signed)
Patient's mother dropped off immunization form to be filled out-placed in dr's folder.  Call [phone] upon completion.

## 2020-05-07 NOTE — Telephone Encounter (Signed)
Patient's mother brought in form to be filled out- placed in dr's folder.  Call [phone] upon completion.

## 2020-05-11 NOTE — Telephone Encounter (Signed)
Called mother and left a detailed voice message for her to get the vaccine records from school since there is a gap in her vaccine record and we do not have all of them on file and to get this to [place] so we can complete her forms.

## 2020-05-19 NOTE — Telephone Encounter (Signed)
Pt's mother dropped off a different form for immunizations. Spoke to [employer] and she stated we are needing the full records of the pt's immunization in order to fill the form out. Pt's mother understood and said she will call the pediatrician and school to see if she can them sent here.   Placed in [employer].  Pt would like to be contacted when completed at [phone]

## 2020-05-20 NOTE — Telephone Encounter (Signed)
I have placed the second form on top and the original form on the bottom. I have filled out what I can for vaccinations. Placed in red folder. Please return once complete. Thank you.

## 2020-05-26 NOTE — Telephone Encounter (Signed)
Completed  physical form   Please add address  Confirmed  There are parts that patient needs to fill out and they will need to produce earlier    Immunizations

## 2020-05-27 NOTE — Telephone Encounter (Signed)
Called patients mother and let her know that she will need to fill out some of her paperwork. Mother stated that the correct address is  [address], [place] [address] Patients mother verbalized an understanding and paperwork has been placed up front for her to pick up.

## 2020-11-30 ENCOUNTER — Telehealth: Payer: Self-pay | Admitting: Internal Medicine

## 2020-11-30 MED ORDER — FLUOXETINE HCL 40 MG PO CAPS: 40.0000 mg | ORAL_CAPSULE | Freq: Every day | ORAL | 0 refills | Status: DC

## 2020-11-30 NOTE — Telephone Encounter (Signed)
Patient is due office visit or med check before this refill runs out. Please arrange appointment virtual or in person for med evaluation We will send in 1 refill of medication.

## 2020-11-30 NOTE — Telephone Encounter (Signed)
Patient mom is requesting a refill for FLUoxetine (PROZAC) 40 MG capsule sent to [place], [place] [address]  Phone:  [phone] Fax:  [phone] CB is [phone]

## 2020-12-01 MED ORDER — FLUOXETINE HCL 40 MG PO CAPS: 40.0000 mg | ORAL_CAPSULE | Freq: Every day | ORAL | 0 refills | Status: AC

## 2020-12-01 NOTE — Telephone Encounter (Signed)
Rx done.  I left a detailed message at the pts cell number a one-month supply was sent to the pharmacy and to call for an appt as below.
